# Patient Record
Sex: Male | Born: 1955 | Race: Black or African American | Hispanic: No | Marital: Single | Smoking: Never smoker
Health system: Southern US, Community
[De-identification: ages and names within clinical notes are randomized; demographics above are authoritative.]

## PROBLEM LIST (undated history)

## (undated) DIAGNOSIS — I10 Essential (primary) hypertension: Secondary | ICD-10-CM

## (undated) DIAGNOSIS — E785 Hyperlipidemia, unspecified: Secondary | ICD-10-CM

## (undated) DIAGNOSIS — E669 Obesity, unspecified: Secondary | ICD-10-CM

## (undated) HISTORY — PX: POLYPECTOMY: SHX149

## (undated) HISTORY — DX: Obesity, unspecified: E66.9

## (undated) HISTORY — DX: Hyperlipidemia, unspecified: E78.5

## (undated) HISTORY — DX: Essential (primary) hypertension: I10

---

## 2008-02-16 HISTORY — PX: COLONOSCOPY: SHX174

## 2008-05-23 ENCOUNTER — Ambulatory Visit: Payer: Self-pay | Admitting: Family Medicine

## 2008-05-24 ENCOUNTER — Encounter: Admission: RE | Admit: 2008-05-24 | Discharge: 2008-05-24 | Payer: Self-pay | Admitting: Cardiology

## 2008-06-06 ENCOUNTER — Ambulatory Visit: Payer: Self-pay | Admitting: Family Medicine

## 2008-06-21 ENCOUNTER — Ambulatory Visit: Payer: Self-pay | Admitting: Family Medicine

## 2008-06-26 ENCOUNTER — Ambulatory Visit: Payer: Self-pay | Admitting: Family Medicine

## 2008-07-16 ENCOUNTER — Ambulatory Visit: Payer: Self-pay | Admitting: Internal Medicine

## 2008-07-31 ENCOUNTER — Encounter: Payer: Self-pay | Admitting: Internal Medicine

## 2008-07-31 ENCOUNTER — Ambulatory Visit: Payer: Self-pay | Admitting: Internal Medicine

## 2008-08-02 ENCOUNTER — Encounter: Payer: Self-pay | Admitting: Internal Medicine

## 2008-08-26 ENCOUNTER — Ambulatory Visit: Payer: Self-pay | Admitting: Family Medicine

## 2008-11-29 ENCOUNTER — Ambulatory Visit: Payer: Self-pay | Admitting: Family Medicine

## 2009-01-06 ENCOUNTER — Ambulatory Visit: Payer: Self-pay | Admitting: Family Medicine

## 2009-12-26 ENCOUNTER — Ambulatory Visit: Payer: Self-pay | Admitting: Family Medicine

## 2010-02-27 ENCOUNTER — Ambulatory Visit
Admission: RE | Admit: 2010-02-27 | Discharge: 2010-02-27 | Payer: Self-pay | Source: Home / Self Care | Attending: Family Medicine | Admitting: Family Medicine

## 2010-10-28 ENCOUNTER — Encounter: Payer: Self-pay | Admitting: Family Medicine

## 2010-10-30 ENCOUNTER — Encounter: Payer: Self-pay | Admitting: Family Medicine

## 2010-10-30 ENCOUNTER — Ambulatory Visit (INDEPENDENT_AMBULATORY_CARE_PROVIDER_SITE_OTHER): Payer: BC Managed Care – PPO | Admitting: Family Medicine

## 2010-10-30 VITALS — BP 130/76 | HR 64 | Wt 182.0 lb

## 2010-10-30 DIAGNOSIS — Z0000 Encounter for general adult medical examination without abnormal findings: Secondary | ICD-10-CM

## 2010-10-30 DIAGNOSIS — Z0389 Encounter for observation for other suspected diseases and conditions ruled out: Secondary | ICD-10-CM

## 2010-10-30 DIAGNOSIS — Z23 Encounter for immunization: Secondary | ICD-10-CM

## 2010-10-30 NOTE — Progress Notes (Signed)
  Subjective:    Patient ID: Jonathan Stone, male    DOB: 1955/11/24, 55 y.o.   MRN: [id number]  HPI He is here for ear lavage. He has no particular symptoms of congestion, earache or feeling a form body in ear   Review of Systems     Objective:   Physical Exam Alert and in no distress. Both canals and TMs are normal.       Assessment & Plan:  Normal exam Flu shot will be given.

## 2011-01-29 ENCOUNTER — Ambulatory Visit (INDEPENDENT_AMBULATORY_CARE_PROVIDER_SITE_OTHER): Payer: BC Managed Care – PPO | Admitting: Family Medicine

## 2011-01-29 ENCOUNTER — Encounter: Payer: Self-pay | Admitting: Family Medicine

## 2011-01-29 VITALS — BP 122/70 | HR 67 | Wt 184.0 lb

## 2011-01-29 DIAGNOSIS — E785 Hyperlipidemia, unspecified: Secondary | ICD-10-CM | POA: Insufficient documentation

## 2011-01-29 DIAGNOSIS — Z79899 Other long term (current) drug therapy: Secondary | ICD-10-CM

## 2011-01-29 DIAGNOSIS — I10 Essential (primary) hypertension: Secondary | ICD-10-CM | POA: Insufficient documentation

## 2011-01-29 DIAGNOSIS — N529 Male erectile dysfunction, unspecified: Secondary | ICD-10-CM | POA: Insufficient documentation

## 2011-01-29 HISTORY — DX: Hyperlipidemia, unspecified: E78.5

## 2011-01-29 LAB — COMPREHENSIVE METABOLIC PANEL WITH GFR
ALT: 20 U/L (ref 0–53)
AST: 25 U/L (ref 0–37)
Albumin: 4.8 g/dL (ref 3.5–5.2)
Alkaline Phosphatase: 72 U/L (ref 39–117)
BUN: 16 mg/dL (ref 6–23)
CO2: 29 meq/L (ref 19–32)
Calcium: 10.1 mg/dL (ref 8.4–10.5)
Chloride: 102 meq/L (ref 96–112)
Creat: 1.14 mg/dL (ref 0.50–1.35)
Glucose, Bld: 97 mg/dL (ref 70–99)
Potassium: 4.2 meq/L (ref 3.5–5.3)
Sodium: 141 meq/L (ref 135–145)
Total Bilirubin: 0.8 mg/dL (ref 0.3–1.2)
Total Protein: 7.2 g/dL (ref 6.0–8.3)

## 2011-01-29 LAB — CBC WITH DIFFERENTIAL/PLATELET
Basophils Absolute: 0 10*3/uL (ref 0.0–0.1)
Basophils Relative: 0 % (ref 0–1)
Eosinophils Absolute: 0.2 10*3/uL (ref 0.0–0.7)
Eosinophils Relative: 3 % (ref 0–5)
HCT: 49.8 % (ref 39.0–52.0)
Hemoglobin: 16.6 g/dL (ref 13.0–17.0)
Lymphocytes Relative: 36 % (ref 12–46)
Lymphs Abs: 2.9 10*3/uL (ref 0.7–4.0)
MCH: 28.2 pg (ref 26.0–34.0)
MCHC: 33.3 g/dL (ref 30.0–36.0)
MCV: 84.6 fL (ref 78.0–100.0)
Monocytes Absolute: 0.7 10*3/uL (ref 0.1–1.0)
Monocytes Relative: 8 % (ref 3–12)
Neutro Abs: 4.3 10*3/uL (ref 1.7–7.7)
Neutrophils Relative %: 53 % (ref 43–77)
Platelets: 153 10*3/uL (ref 150–400)
RBC: 5.89 MIL/uL — ABNORMAL HIGH (ref 4.22–5.81)
RDW: 13.6 % (ref 11.5–15.5)
WBC: 8.1 10*3/uL (ref 4.0–10.5)

## 2011-01-29 LAB — LIPID PANEL
Cholesterol: 157 mg/dL (ref 0–200)
HDL: 41 mg/dL (ref >39)
Triglycerides: 160 mg/dL — ABNORMAL HIGH (ref <150)

## 2011-01-29 MED ORDER — ROSUVASTATIN CALCIUM 10 MG PO TABS: 10.0000 mg | ORAL_TABLET | Freq: Every day | ORAL | Status: DC

## 2011-01-29 MED ORDER — AMLODIPINE BESYLATE 5 MG PO TABS: 5.0000 mg | ORAL_TABLET | Freq: Every day | ORAL | Status: DC

## 2011-01-29 MED ORDER — LISINOPRIL-HYDROCHLOROTHIAZIDE 20-12.5 MG PO TABS: 1.0000 | ORAL_TABLET | Freq: Every day | ORAL | Status: DC

## 2011-01-29 NOTE — Progress Notes (Signed)
  Subjective:    Patient ID: Jonathan Stone, male    DOB: Jun 20, 1955, 55 y.o.   MRN: [id number]  HPI He is here for an interval evaluation. He continues on his blood pressure medications as well as Crestor. He also has yet to try the ED medication the plan is to try it in the near future.   Review of Systems     Objective:   Physical Exam  alert and in no distress. Cardiac and lung exam are normal.       Assessment & Plan:   1. Hypertension  CBC with Differential, Comprehensive metabolic panel  2. Hyperlipidemia LDL goal < 100  Lipid panel  3. ED (erectile dysfunction)  CBC with Differential, Comprehensive metabolic panel, Lipid panel  4. Encounter for long-term (current) use of other medications  CBC with Differential, Comprehensive metabolic panel, Lipid panel   he will try the ED medication and me know how works

## 2011-01-29 NOTE — Patient Instructions (Signed)
Stay on your present medications. We will call you with the blood results. Let he know if the Viagra works and I will give you more.

## 2011-10-04 ENCOUNTER — Encounter: Payer: Self-pay | Admitting: Internal Medicine

## 2011-10-26 ENCOUNTER — Telehealth: Payer: Self-pay | Admitting: Family Medicine

## 2011-10-26 NOTE — Telephone Encounter (Signed)
LM

## 2011-11-01 ENCOUNTER — Encounter: Payer: Self-pay | Admitting: Family Medicine

## 2011-11-01 ENCOUNTER — Ambulatory Visit (INDEPENDENT_AMBULATORY_CARE_PROVIDER_SITE_OTHER): Payer: BC Managed Care – PPO | Admitting: Family Medicine

## 2011-11-01 VITALS — BP 130/70 | HR 88 | Wt 175.0 lb

## 2011-11-01 DIAGNOSIS — I10 Essential (primary) hypertension: Secondary | ICD-10-CM

## 2011-11-01 NOTE — Progress Notes (Signed)
  Subjective:    Patient ID: Jonathan Stone, male    DOB: September 17, 1955, 56 y.o.   MRN: [id number]  HPI Concerning his hypertension. 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 He continues on medications listed in the chart. He has not smoke or drink. He exercises regularly.   Review of Systems     Objective:   Physical Exam Alert and in no distress. Blood pressure is recorded       Assessment & Plan:   1. Hypertension    I again reinforced the need for him to do diet and exercise. He is doing a good job. He is to recheck with me several weeks before he has his next appointment for DOT evaluation

## 2011-12-24 ENCOUNTER — Encounter: Payer: Self-pay | Admitting: Internal Medicine

## 2012-01-03 ENCOUNTER — Ambulatory Visit (INDEPENDENT_AMBULATORY_CARE_PROVIDER_SITE_OTHER): Payer: BC Managed Care – PPO | Admitting: Family Medicine

## 2012-01-03 ENCOUNTER — Encounter: Payer: Self-pay | Admitting: Family Medicine

## 2012-01-03 VITALS — BP 128/74 | Ht 66.5 in | Wt 180.0 lb

## 2012-01-03 DIAGNOSIS — I10 Essential (primary) hypertension: Secondary | ICD-10-CM

## 2012-01-03 NOTE — Progress Notes (Signed)
  Subjective:    Patient ID: Jonathan Stone, male    DOB: [birth year], 56 y.o.   MRN: [id number]  HPI He is here for recheck. He has been taking his blood pressure medicines and having no difficulty.  Review of Systems     Objective:   Physical Exam Alert and in no distress. Blood pressure is recorded.       Assessment & Plan:   1. Hypertension    I encouraged him to continue on his medications. Followup here as needed.

## 2012-03-05 ENCOUNTER — Other Ambulatory Visit: Payer: Self-pay | Admitting: Family Medicine

## 2012-03-27 ENCOUNTER — Telehealth: Payer: Self-pay | Admitting: Family Medicine

## 2012-03-27 NOTE — Telephone Encounter (Signed)
BP that day was 128/74. Please send a note

## 2012-03-27 NOTE — Telephone Encounter (Signed)
FAXED LETTER TO [facility] [employer] PER PT REQUEST

## 2012-04-10 ENCOUNTER — Encounter: Payer: Self-pay | Admitting: Family Medicine

## 2012-04-10 ENCOUNTER — Ambulatory Visit (INDEPENDENT_AMBULATORY_CARE_PROVIDER_SITE_OTHER): Payer: BC Managed Care – PPO | Admitting: Family Medicine

## 2012-04-10 VITALS — BP 140/80 | HR 88 | Wt 184.0 lb

## 2012-04-10 DIAGNOSIS — I10 Essential (primary) hypertension: Secondary | ICD-10-CM

## 2012-04-10 NOTE — Progress Notes (Signed)
  Subjective:    Patient ID: Jonathan Stone, male    DOB: 1955/05/27, 57 y.o.   MRN: [id number]  HPI He is here for blood pressure recheck. Apparently his place of work once his blood pressure rechecked.   Review of Systems     Objective:   Physical Exam  Alert and in no distress. Blood pressure is recorded.     Assessment & Plan:   Hypertension continue on present medication regimen.

## 2012-05-25 ENCOUNTER — Encounter: Payer: Self-pay | Admitting: Internal Medicine

## 2012-09-16 ENCOUNTER — Other Ambulatory Visit: Payer: Self-pay | Admitting: Family Medicine

## 2012-09-18 ENCOUNTER — Telehealth: Payer: Self-pay | Admitting: Family Medicine

## 2012-09-18 MED ORDER — AMLODIPINE BESYLATE 5 MG PO TABS: ORAL_TABLET | ORAL | Status: DC

## 2012-09-18 NOTE — Telephone Encounter (Signed)
norvasc not refilled because it was not sent for refill but now sent in

## 2012-09-18 NOTE — Telephone Encounter (Signed)
Pt called and made an appt for a medcheck for sept. Pt states two of his meds were refilled but norvasc wasn't. Please refill. Send to [facility].

## 2012-10-30 ENCOUNTER — Encounter: Payer: Self-pay | Admitting: Family Medicine

## 2012-11-17 ENCOUNTER — Encounter: Payer: Self-pay | Admitting: Family Medicine

## 2012-11-17 ENCOUNTER — Ambulatory Visit (INDEPENDENT_AMBULATORY_CARE_PROVIDER_SITE_OTHER): Payer: BC Managed Care – PPO | Admitting: Family Medicine

## 2012-11-17 VITALS — BP 132/80 | HR 62 | Wt 178.0 lb

## 2012-11-17 DIAGNOSIS — E785 Hyperlipidemia, unspecified: Secondary | ICD-10-CM

## 2012-11-17 DIAGNOSIS — R519 Headache, unspecified: Secondary | ICD-10-CM

## 2012-11-17 DIAGNOSIS — Z79899 Other long term (current) drug therapy: Secondary | ICD-10-CM

## 2012-11-17 DIAGNOSIS — I10 Essential (primary) hypertension: Secondary | ICD-10-CM

## 2012-11-17 DIAGNOSIS — R51 Headache: Secondary | ICD-10-CM

## 2012-11-17 DIAGNOSIS — Z23 Encounter for immunization: Secondary | ICD-10-CM

## 2012-11-17 LAB — CBC WITH DIFFERENTIAL/PLATELET
Basophils Absolute: 0 10*3/uL (ref 0.0–0.1)
Eosinophils Relative: 2 % (ref 0–5)
HCT: 45.7 % (ref 39.0–52.0)
Lymphocytes Relative: 43 % (ref 12–46)
MCV: 79.9 fL (ref 78.0–100.0)
Monocytes Absolute: 0.5 10*3/uL (ref 0.1–1.0)
RDW: 14.1 % (ref 11.5–15.5)
WBC: 7 10*3/uL (ref 4.0–10.5)

## 2012-11-17 LAB — LIPID PANEL
Cholesterol: 123 mg/dL (ref 0–200)
HDL: 38 mg/dL — ABNORMAL LOW (ref >39)
Total CHOL/HDL Ratio: 3.2 Ratio
Triglycerides: 147 mg/dL (ref <150)

## 2012-11-17 LAB — COMPREHENSIVE METABOLIC PANEL
AST: 24 U/L (ref 0–37)
BUN: 13 mg/dL (ref 6–23)
CO2: 30 mEq/L (ref 19–32)
Calcium: 9.9 mg/dL (ref 8.4–10.5)
Chloride: 102 mEq/L (ref 96–112)
Creat: 1.24 mg/dL (ref 0.50–1.35)

## 2012-11-17 MED ORDER — AMLODIPINE BESYLATE 5 MG PO TABS: ORAL_TABLET | ORAL | Status: DC

## 2012-11-17 MED ORDER — LISINOPRIL-HYDROCHLOROTHIAZIDE 20-12.5 MG PO TABS: ORAL_TABLET | ORAL | Status: DC

## 2012-11-17 MED ORDER — ROSUVASTATIN CALCIUM 10 MG PO TABS: ORAL_TABLET | ORAL | Status: DC

## 2012-11-17 NOTE — Progress Notes (Signed)
  Subjective:    Patient ID: Jonathan Stone, male    DOB: 22-Nov-1955, 58 y.o.   MRN: [id number]  HPI He is here for medication check. He did run out of his Crestor and missed an appointment to get back on it. He continues on his other medications. He also complains of a three-day history of left parietal scalp tenderness but no rash. He does not describe this as a headache. His had no fever, chills earache or sore throat.  Review of Systems     Objective:   Physical Exam Alert and in no distress. Exam of the scalp shows no visible lesions. TMs, throat and neck normal.       Assessment & Plan:  Hypertension - Plan: CBC with Differential, Comprehensive metabolic panel, amLODipine (NORVASC) 5 MG tablet, lisinopril-hydrochlorothiazide (PRINZIDE,ZESTORETIC) 20-12.5 MG per tablet  Hyperlipidemia LDL goal < 100 - Plan: Lipid panel, rosuvastatin (CRESTOR) 10 MG tablet  Need for prophylactic vaccination and inoculation against influenza - Plan: Flu Vaccine QUAD 36+ mos IM  Encounter for long-term (current) use of other medications  Headache  flu shot given with risks and benefits discussed. I will renew his medications and do routine screening. Discussed the headache and the possibility of this being early Shingles. He will be vigilant concerning the appearance of a rash.

## 2012-12-18 ENCOUNTER — Ambulatory Visit: Payer: BC Managed Care – PPO | Admitting: Family Medicine

## 2012-12-25 ENCOUNTER — Ambulatory Visit (INDEPENDENT_AMBULATORY_CARE_PROVIDER_SITE_OTHER): Payer: BC Managed Care – PPO | Admitting: Family Medicine

## 2012-12-25 ENCOUNTER — Encounter: Payer: Self-pay | Admitting: Family Medicine

## 2012-12-25 VITALS — BP 120/80 | HR 78 | Wt 182.0 lb

## 2012-12-25 DIAGNOSIS — I10 Essential (primary) hypertension: Secondary | ICD-10-CM

## 2012-12-25 NOTE — Progress Notes (Signed)
  Subjective:    Patient ID: Jonathan Stone, male    DOB: 08-30-1955, 57 y.o.   MRN: [id number]  HPI Is here  for blood pressure recheck. 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 He is here for a followup concerning this.   Review of Systems     Objective:   Physical Exam  Alert and in no distress. Blood pressure is recorded.      Assessment & Plan:  Hypertension  a letter was written with his present blood pressure reading.

## 2013-10-22 ENCOUNTER — Other Ambulatory Visit: Payer: Self-pay | Admitting: Family Medicine

## 2013-10-24 ENCOUNTER — Encounter: Payer: Self-pay | Admitting: Internal Medicine

## 2013-11-19 ENCOUNTER — Telehealth: Payer: Self-pay | Admitting: Internal Medicine

## 2013-11-19 ENCOUNTER — Encounter: Payer: Self-pay | Admitting: Family Medicine

## 2013-11-19 ENCOUNTER — Ambulatory Visit (INDEPENDENT_AMBULATORY_CARE_PROVIDER_SITE_OTHER): Payer: BC Managed Care – PPO | Admitting: Family Medicine

## 2013-11-19 VITALS — BP 150/88 | HR 72 | Wt 183.0 lb

## 2013-11-19 DIAGNOSIS — R319 Hematuria, unspecified: Secondary | ICD-10-CM

## 2013-11-19 DIAGNOSIS — Z23 Encounter for immunization: Secondary | ICD-10-CM

## 2013-11-19 LAB — POCT URINALYSIS DIPSTICK
BILIRUBIN UA: NEGATIVE
Blood, UA: 50
GLUCOSE UA: NEGATIVE
Ketones, UA: NEGATIVE
LEUKOCYTES UA: NEGATIVE
NITRITE UA: NEGATIVE
Protein, UA: NEGATIVE
Spec Grav, UA: 1.02
UROBILINOGEN UA: NEGATIVE
pH, UA: 5

## 2013-11-19 NOTE — Telephone Encounter (Signed)
Pt called back and can not make his appt on October 13th for a renal US so he will call [facility] and reschedule his appt. Pt was also told that he  needed to follow-up within a month to see Dr. Redmond School again

## 2013-11-19 NOTE — Progress Notes (Signed)
   Subjective:    Patient ID: Jonathan Stone, male    DOB: June 12, 1955, 58 y.o.   MRN: [id number]  HPI He is here for evaluation of blood in his urine. 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 Review of my record indicates he has never mentioned that to me. He's had no back pain, abdominal pain, urinary symptoms.   Review of Systems     Objective:   Physical Exam Alert and in no distress. Urine dipstick did show red cells. Urine microscopic showed scattered red cells.     Assessment & Plan:  Immunization due - Plan: Flu Vaccine QUAD 36+ mos IM  Blood in urine - Plan: POCT urinalysis dipstick  Hematuria, undiagnosed cause - Plan: US Renal  Since he has had previous reports of blood in his urine, I think it is warranted to take the next step and get an ultrasound. He will also return here for repeat urinalysis in several months.

## 2013-11-19 NOTE — Telephone Encounter (Signed)
Called an left a message for pt to call me back.  I have set him up for a renal US on October 13th @ 10:00am. He is to be there at 9:45am for registration. He is to go to [address] [address]. If he can not make it he can call [facility] at [phone] to reschedule

## 2013-11-26 ENCOUNTER — Ambulatory Visit
Admission: RE | Admit: 2013-11-26 | Discharge: 2013-11-26 | Disposition: A | Discharge status: Home / Self Care | Payer: BC Managed Care – PPO | Source: Ambulatory Visit | Attending: Family Medicine | Admitting: Family Medicine

## 2013-11-26 DIAGNOSIS — R319 Hematuria, unspecified: Secondary | ICD-10-CM

## 2013-11-27 ENCOUNTER — Other Ambulatory Visit: Payer: BC Managed Care – PPO

## 2013-12-15 ENCOUNTER — Telehealth: Payer: Self-pay | Admitting: Internal Medicine

## 2013-12-15 NOTE — Telephone Encounter (Signed)
Faxed over medical records to [facility] on 11/26/13 @ [phone]

## 2013-12-22 ENCOUNTER — Other Ambulatory Visit: Payer: Self-pay | Admitting: Family Medicine

## 2013-12-28 ENCOUNTER — Other Ambulatory Visit: Payer: Self-pay | Admitting: Family Medicine

## 2014-01-14 ENCOUNTER — Other Ambulatory Visit: Payer: Self-pay

## 2014-01-14 ENCOUNTER — Encounter: Payer: Self-pay | Admitting: Family Medicine

## 2014-01-14 ENCOUNTER — Ambulatory Visit (INDEPENDENT_AMBULATORY_CARE_PROVIDER_SITE_OTHER): Payer: BC Managed Care – PPO | Admitting: Family Medicine

## 2014-01-14 VITALS — BP 130/90 | HR 64 | Wt 183.0 lb

## 2014-01-14 DIAGNOSIS — R8290 Unspecified abnormal findings in urine: Secondary | ICD-10-CM

## 2014-01-14 DIAGNOSIS — R319 Hematuria, unspecified: Secondary | ICD-10-CM

## 2014-01-14 LAB — CBC WITH DIFFERENTIAL/PLATELET
BASOS ABS: 0 10*3/uL (ref 0.0–0.1)
Basophils Relative: 0 % (ref 0–1)
EOS ABS: 0.2 10*3/uL (ref 0.0–0.7)
EOS PCT: 3 % (ref 0–5)
HCT: 48.3 % (ref 39.0–52.0)
Hemoglobin: 16.7 g/dL (ref 13.0–17.0)
LYMPHS PCT: 47 % — AB (ref 12–46)
Lymphs Abs: 3.7 10*3/uL (ref 0.7–4.0)
MCH: 27.7 pg (ref 26.0–34.0)
MCHC: 34.6 g/dL (ref 30.0–36.0)
MCV: 80.2 fL (ref 78.0–100.0)
MPV: 11.5 fL (ref 9.4–12.4)
Monocytes Absolute: 0.6 10*3/uL (ref 0.1–1.0)
Monocytes Relative: 7 % (ref 3–12)
NEUTROS PCT: 43 % (ref 43–77)
Neutro Abs: 3.4 10*3/uL (ref 1.7–7.7)
PLATELETS: 166 10*3/uL (ref 150–400)
RBC: 6.02 MIL/uL — ABNORMAL HIGH (ref 4.22–5.81)
RDW: 14.2 % (ref 11.5–15.5)
WBC: 7.9 10*3/uL (ref 4.0–10.5)

## 2014-01-14 LAB — COMPREHENSIVE METABOLIC PANEL
ALK PHOS: 79 U/L (ref 39–117)
ALT: 20 U/L (ref 0–53)
AST: 24 U/L (ref 0–37)
Albumin: 4.5 g/dL (ref 3.5–5.2)
BUN: 11 mg/dL (ref 6–23)
CO2: 29 mEq/L (ref 19–32)
CREATININE: 1.23 mg/dL (ref 0.50–1.35)
Calcium: 9.7 mg/dL (ref 8.4–10.5)
Chloride: 100 mEq/L (ref 96–112)
Glucose, Bld: 101 mg/dL — ABNORMAL HIGH (ref 70–99)
POTASSIUM: 4.2 meq/L (ref 3.5–5.3)
Sodium: 138 mEq/L (ref 135–145)
Total Bilirubin: 0.6 mg/dL (ref 0.2–1.2)
Total Protein: 7.3 g/dL (ref 6.0–8.3)

## 2014-01-14 LAB — POCT URINALYSIS DIPSTICK
Bilirubin, UA: NEGATIVE
Blood, UA: POSITIVE
GLUCOSE UA: NEGATIVE
Ketones, UA: NEGATIVE
LEUKOCYTES UA: NEGATIVE
NITRITE UA: NEGATIVE
PROTEIN UA: NEGATIVE
SPEC GRAV UA: 1.025
UROBILINOGEN UA: NEGATIVE
pH, UA: 6

## 2014-01-14 MED ORDER — AMLODIPINE BESYLATE 5 MG PO TABS: 5.0000 mg | ORAL_TABLET | Freq: Every day | ORAL | Status: DC

## 2014-01-14 MED ORDER — ROSUVASTATIN CALCIUM 10 MG PO TABS: 10.0000 mg | ORAL_TABLET | Freq: Every day | ORAL | Status: DC

## 2014-01-14 MED ORDER — LISINOPRIL-HYDROCHLOROTHIAZIDE 20-12.5 MG PO TABS: 1.0000 | ORAL_TABLET | Freq: Every day | ORAL | Status: DC

## 2014-01-14 NOTE — Progress Notes (Signed)
   Subjective:    Patient ID: Jonathan Stone, male    DOB: August 31, 1955, 58 y.o.   MRN: [id number]  HPI He is here for a recheck on history of hematuria. Doesn't really he is having no symptoms.   Review of Systems     Objective:   Physical Exam Urine dipstick did show red cells however on microscopic no red cells were seen.       Assessment & Plan:  Abnormal urine - Plan: POCT Urinalysis Dipstick, CBC with Differential, Comprehensive metabolic panel, Ambulatory referral to Urology  Hematuria - Plan: CBC with Differential, Comprehensive metabolic panel, Ambulatory referral to Urology  I will set him up for urology evaluation to cover all the bases.

## 2014-07-17 ENCOUNTER — Ambulatory Visit: Payer: Self-pay | Admitting: Family Medicine

## 2014-07-30 ENCOUNTER — Telehealth: Payer: Self-pay | Admitting: Internal Medicine

## 2014-07-30 NOTE — Telephone Encounter (Signed)
Faxed over medical records to [facility] @ [phone]

## 2014-08-23 ENCOUNTER — Encounter: Payer: Self-pay | Admitting: Family Medicine

## 2014-08-23 ENCOUNTER — Ambulatory Visit (INDEPENDENT_AMBULATORY_CARE_PROVIDER_SITE_OTHER): Payer: BLUE CROSS/BLUE SHIELD | Admitting: Family Medicine

## 2014-08-23 VITALS — BP 130/80 | HR 60 | Ht 67.0 in | Wt 179.0 lb

## 2014-08-23 DIAGNOSIS — I10 Essential (primary) hypertension: Secondary | ICD-10-CM | POA: Diagnosis not present

## 2014-08-23 DIAGNOSIS — E785 Hyperlipidemia, unspecified: Secondary | ICD-10-CM | POA: Diagnosis not present

## 2014-08-23 DIAGNOSIS — Z0000 Encounter for general adult medical examination without abnormal findings: Secondary | ICD-10-CM

## 2014-08-23 DIAGNOSIS — N528 Other male erectile dysfunction: Secondary | ICD-10-CM | POA: Diagnosis not present

## 2014-08-23 DIAGNOSIS — R319 Hematuria, unspecified: Secondary | ICD-10-CM | POA: Diagnosis not present

## 2014-08-23 LAB — LIPID PANEL
CHOL/HDL RATIO: 2.9 ratio
Cholesterol: 129 mg/dL (ref 0–200)
HDL: 44 mg/dL (ref >=40)
LDL CALC: 67 mg/dL (ref 0–99)
Triglycerides: 92 mg/dL (ref <150)
VLDL: 18 mg/dL (ref 0–40)

## 2014-08-23 LAB — POCT URINALYSIS DIPSTICK
BILIRUBIN UA: NEGATIVE
GLUCOSE UA: NEGATIVE
KETONES UA: NEGATIVE
Leukocytes, UA: NEGATIVE
Nitrite, UA: NEGATIVE
SPEC GRAV UA: 1.025
UROBILINOGEN UA: NEGATIVE
pH, UA: 6

## 2014-08-23 MED ORDER — AMLODIPINE BESYLATE 5 MG PO TABS: 5.0000 mg | ORAL_TABLET | Freq: Every day | ORAL | Status: DC

## 2014-08-23 MED ORDER — LISINOPRIL-HYDROCHLOROTHIAZIDE 20-12.5 MG PO TABS: 1.0000 | ORAL_TABLET | Freq: Every day | ORAL | Status: DC

## 2014-08-23 MED ORDER — ROSUVASTATIN CALCIUM 10 MG PO TABS: 10.0000 mg | ORAL_TABLET | Freq: Every day | ORAL | Status: DC

## 2014-08-23 NOTE — Progress Notes (Signed)
   Subjective:    Patient ID: Jonathan Stone, male    DOB: 06-21-1955, 59 y.o.   MRN: [id number]  HPI He is here for an examination. He does have a history of hypertension as well as hyperlipidemia. He is on appropriate medications for this. He has had difficulty with erectile dysfunction the past but none recently. He has a history of hematuria and an appointment was set up. That record is not here. He has no other concerns or complaints. His work and home life are going well. Family and social history as well as health maintenance was reviewed. He has had no chest pain, nausea, vomiting, other GI symptoms   Review of Systems  All other systems reviewed and are negative.      Objective:   Physical Exam BP 130/80 mmHg  Pulse 60  Ht 5\' 7"  (1.702 m)  Wt 179 lb (81.194 kg)  BMI 28.03 kg/m2  SpO2 98%  General Appearance:    Alert, cooperative, no distress, appears stated age  Head:    Normocephalic, without obvious abnormality, atraumatic  Eyes:    PERRL, conjunctiva/corneas clear, EOM's intact, fundi    benign  Ears:    Normal TM's and external ear canals  Nose:   Nares normal, mucosa normal, no drainage or sinus   tenderness  Throat:   Lips, mucosa, and tongue normal; teeth and gums normal  Neck:   Supple, no lymphadenopathy;  thyroid:  no   enlargement/tenderness/nodules; no carotid   bruit or JVD  Back:    Spine nontender, no curvature, ROM normal, no CVA     tenderness  Lungs:     Clear to auscultation bilaterally without wheezes, rales or     ronchi; respirations unlabored  Chest Wall:    No tenderness or deformity   Heart:    Regular rate and rhythm, S1 and S2 normal, 1/6 SEM, no rub   or gallop  Breast Exam:    No chest wall tenderness, masses or gynecomastia  Abdomen:     Soft, non-tender, nondistended, normoactive bowel sounds,    no masses, no hepatosplenomegaly        Extremities:   No clubbing, cyanosis or edema  Pulses:   2+ and symmetric all extremities  Skin:    Skin color, texture, turgor normal, no rashes or lesions  Lymph nodes:   Cervical, supraclavicular, and axillary nodes normal  Neurologic:   CNII-XII intact, normal strength, sensation and gait; reflexes 2+ and symmetric throughout          Psych:   Normal mood, affect, hygiene and grooming.   Urine dipstick is positive for red cells.       Assessment & Plan:  Routine general medical examination at a health care facility - Plan: POCT Urinalysis Dipstick, Lipid panel  Essential hypertension - Plan: lisinopril-hydrochlorothiazide (PRINZIDE,ZESTORETIC) 20-12.5 MG per tablet, amLODipine (NORVASC) 5 MG tablet  Hyperlipidemia with target LDL less than 100 - Plan: Lipid panel, rosuvastatin (CRESTOR) 10 MG tablet  Other male erectile dysfunction  Hematuria - Plan: Ambulatory referral to Urology Apparently an appointment was made However he never did go in for evaluation of the hematuria. I will set this up again.

## 2015-01-14 IMAGING — US US RENAL
1 series · 14 of 25 positions shown · non-contrast
Comparison: None

CLINICAL DATA: Microscopic hematuria, no cause

EXAM:
RENAL/URINARY TRACT ULTRASOUND COMPLETE

[Series 1: us renal · 0.26mm/px · 14 of 45 slices shown]
[im 1/45]
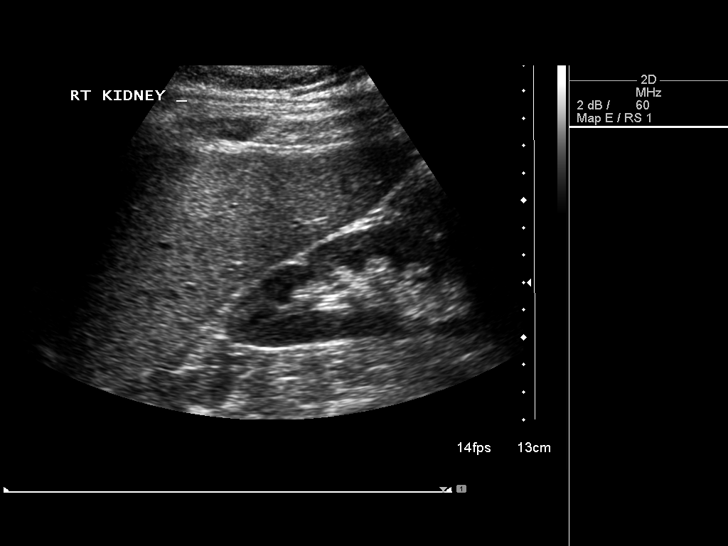
[im 4/45]
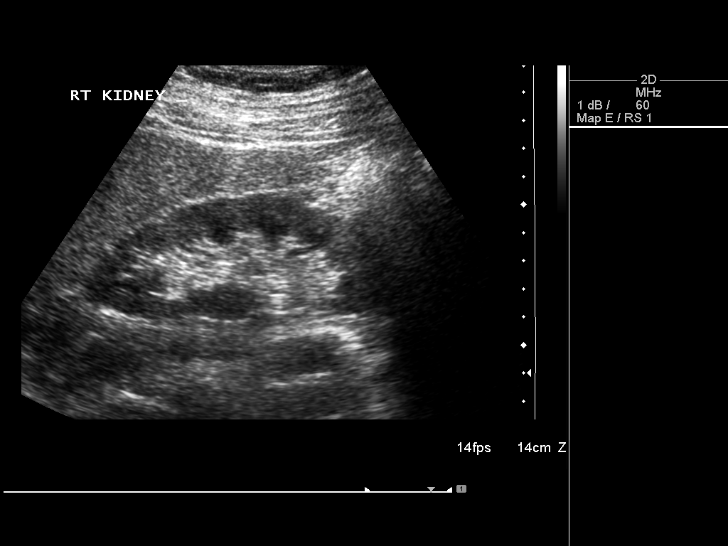
[im 8/45]
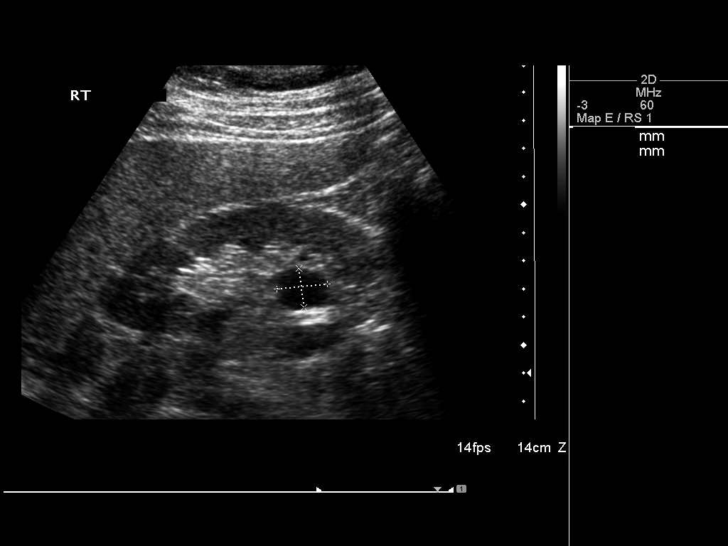
[im 12/45]
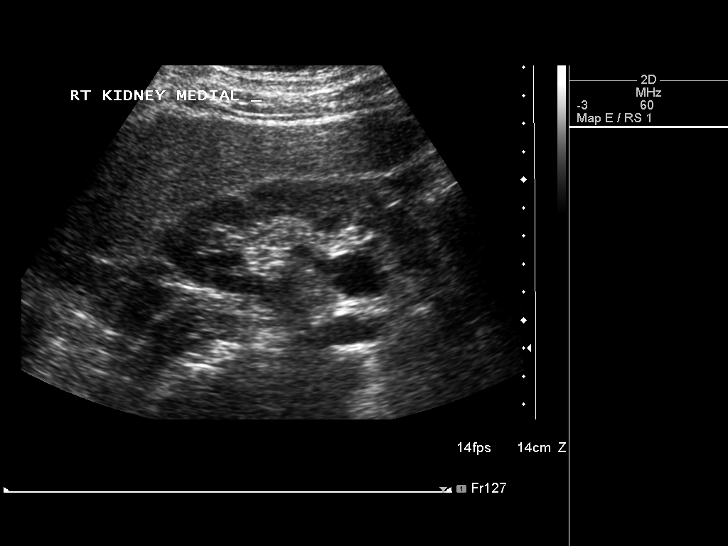
[im 15/45]
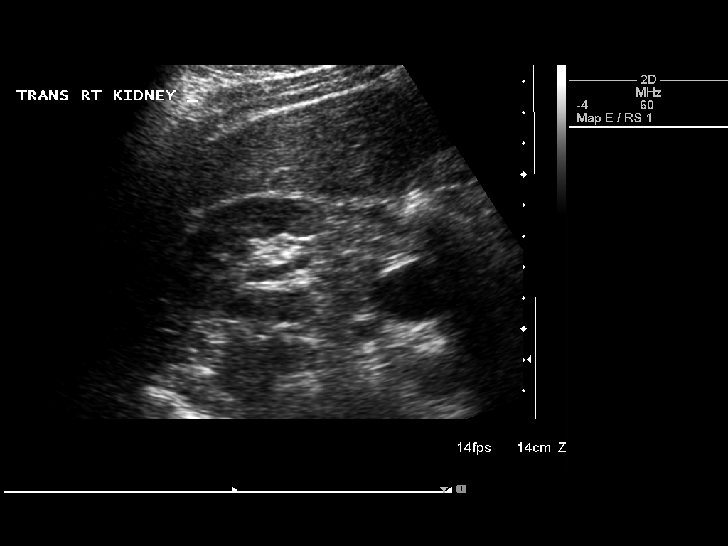
[im 17/45]
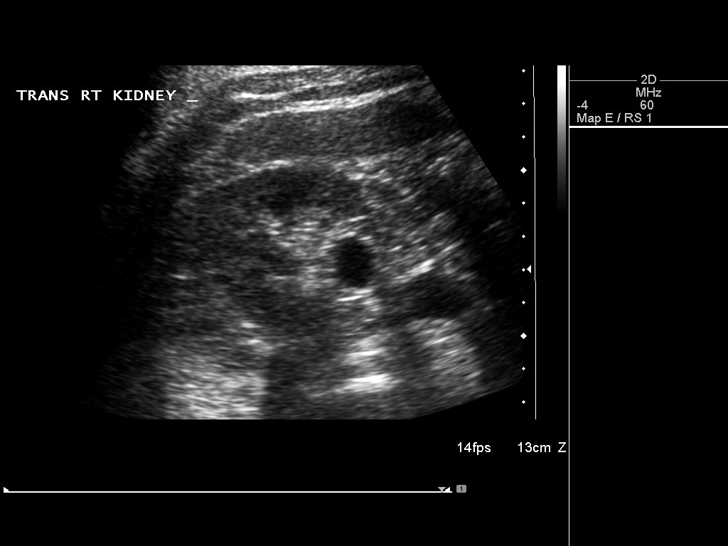
[im 21/45]
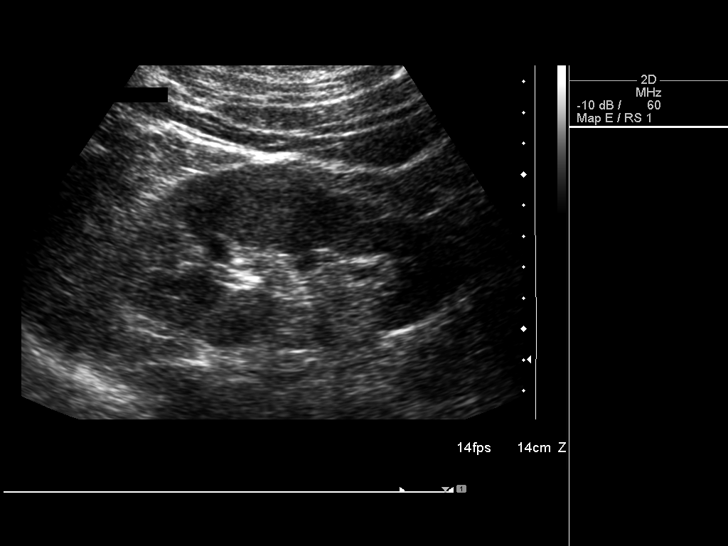
[im 24/45]
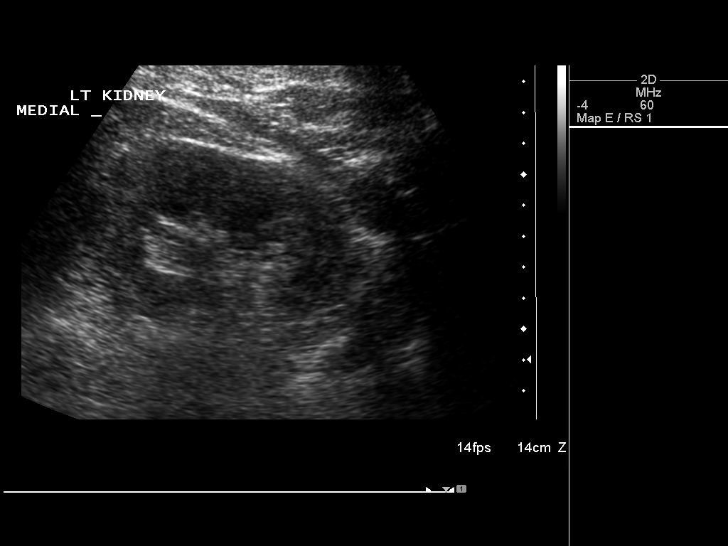
[im 28/45]
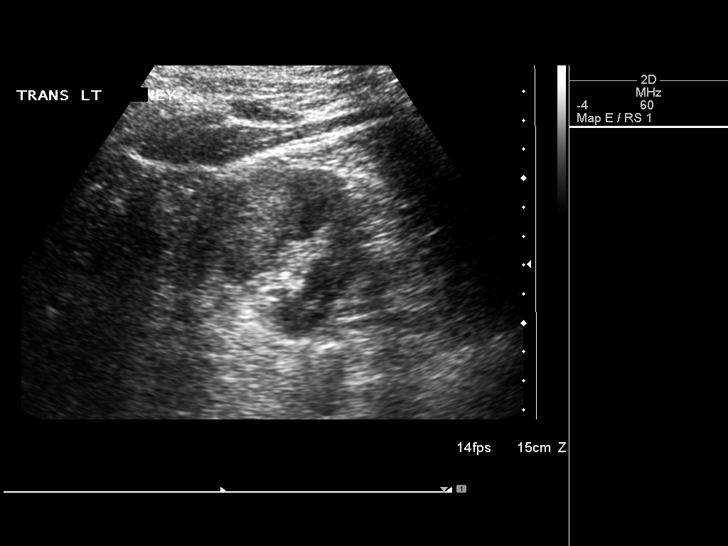
[im 30/45]
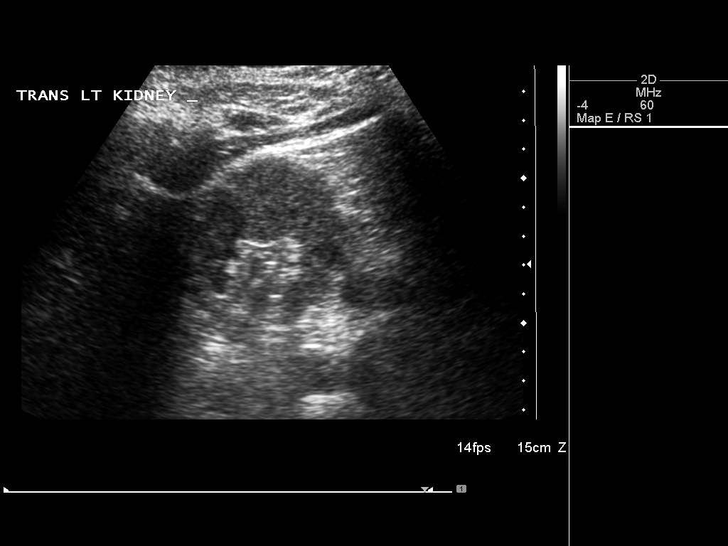
[im 34/45]
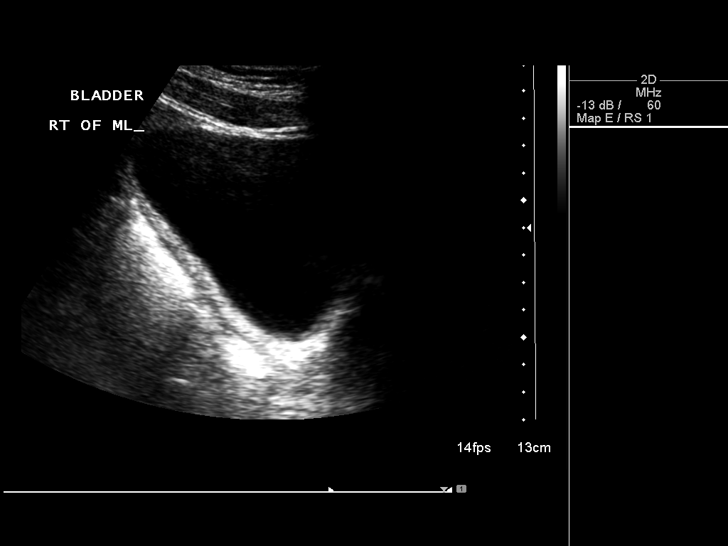
[im 37/45]
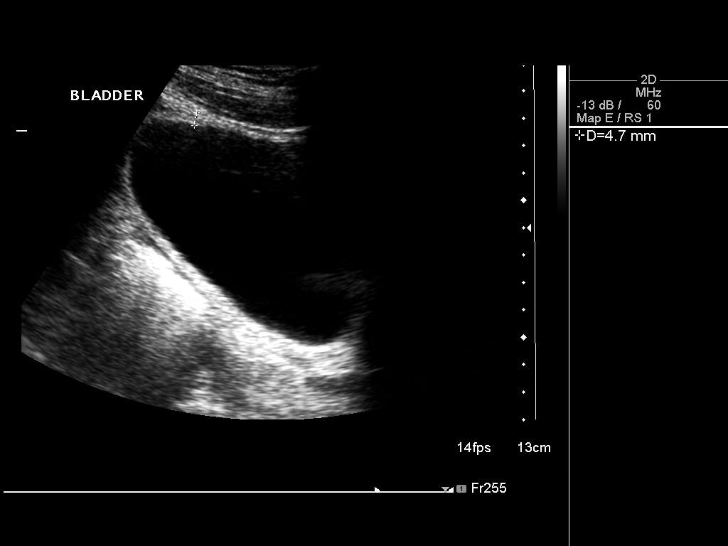
[im 41/45]
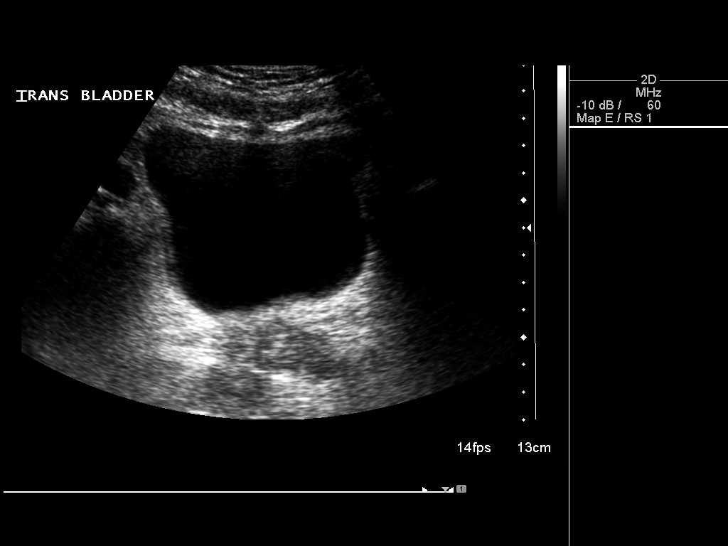
[im 45/45]
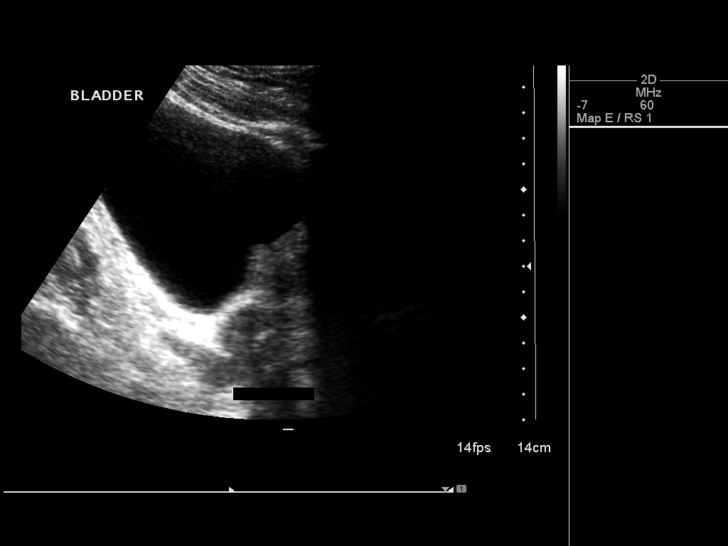

[14 of 25 positions shown; findings below may reference images not displayed]

FINDINGS: Right Kidney:

Length: 11.8 cm. There is a 1.8 x 1.4 x 1.5 cm parapelvic cyst.
Echogenicity within normal limits. No mass or hydronephrosis
visualized.

Left Kidney:

Length: 11.5 cm. Echogenicity within normal limits. No mass or
hydronephrosis visualized.

Bladder:

Mild relative bladder wall thickening.
IMPRESSION: 1. No obstructive uropathy.
2. No nephrolithiasis.
3. Mi relative bladder wall thickening which may be secondary to
under distention versus cystitis versus other intrinsic bladder wall
abnormality.

## 2015-09-19 ENCOUNTER — Encounter: Payer: BLUE CROSS/BLUE SHIELD | Admitting: Family Medicine

## 2015-09-22 ENCOUNTER — Ambulatory Visit (INDEPENDENT_AMBULATORY_CARE_PROVIDER_SITE_OTHER): Payer: BLUE CROSS/BLUE SHIELD | Admitting: Family Medicine

## 2015-09-22 ENCOUNTER — Encounter: Payer: Self-pay | Admitting: Family Medicine

## 2015-09-22 VITALS — BP 180/100 | HR 60 | Ht 66.5 in | Wt 184.6 lb

## 2015-09-22 DIAGNOSIS — I10 Essential (primary) hypertension: Secondary | ICD-10-CM

## 2015-09-22 DIAGNOSIS — Z0000 Encounter for general adult medical examination without abnormal findings: Secondary | ICD-10-CM

## 2015-09-22 DIAGNOSIS — E785 Hyperlipidemia, unspecified: Secondary | ICD-10-CM

## 2015-09-22 DIAGNOSIS — Z1159 Encounter for screening for other viral diseases: Secondary | ICD-10-CM | POA: Diagnosis not present

## 2015-09-22 LAB — CBC WITH DIFFERENTIAL/PLATELET
BASOS PCT: 0 %
Basophils Absolute: 0 cells/uL (ref 0–200)
Eosinophils Absolute: 234 cells/uL (ref 15–500)
Eosinophils Relative: 3 %
HCT: 46.5 % (ref 38.5–50.0)
Hemoglobin: 15.9 g/dL (ref 13.2–17.1)
Lymphocytes Relative: 44 %
Lymphs Abs: 3432 cells/uL (ref 850–3900)
MCH: 27.4 pg (ref 27.0–33.0)
MCHC: 34.2 g/dL (ref 32.0–36.0)
MCV: 80 fL (ref 80.0–100.0)
MONOS PCT: 8 %
MPV: 12.8 fL — AB (ref 7.5–12.5)
Monocytes Absolute: 624 cells/uL (ref 200–950)
NEUTROS ABS: 3510 {cells}/uL (ref 1500–7800)
Neutrophils Relative %: 45 %
PLATELETS: 154 10*3/uL (ref 140–400)
RBC: 5.81 MIL/uL — AB (ref 4.20–5.80)
RDW: 13.9 % (ref 11.0–15.0)
WBC: 7.8 10*3/uL (ref 4.0–10.5)

## 2015-09-22 LAB — LIPID PANEL
Cholesterol: 163 mg/dL (ref 125–200)
HDL: 48 mg/dL (ref >=40)
LDL Cholesterol: 99 mg/dL (ref <130)
Total CHOL/HDL Ratio: 3.4 Ratio (ref <=5.0)
Triglycerides: 80 mg/dL (ref <150)
VLDL: 16 mg/dL (ref <30)

## 2015-09-22 LAB — COMPREHENSIVE METABOLIC PANEL
ALT: 24 U/L (ref 9–46)
AST: 21 U/L (ref 10–35)
Albumin: 4.3 g/dL (ref 3.6–5.1)
Alkaline Phosphatase: 66 U/L (ref 40–115)
BILIRUBIN TOTAL: 0.6 mg/dL (ref 0.2–1.2)
BUN: 13 mg/dL (ref 7–25)
CO2: 29 mmol/L (ref 20–31)
CREATININE: 1.12 mg/dL (ref 0.70–1.33)
Calcium: 9.4 mg/dL (ref 8.6–10.3)
Chloride: 104 mmol/L (ref 98–110)
Glucose, Bld: 93 mg/dL (ref 65–99)
Potassium: 4.7 mmol/L (ref 3.5–5.3)
SODIUM: 140 mmol/L (ref 135–146)
TOTAL PROTEIN: 6.6 g/dL (ref 6.1–8.1)

## 2015-09-22 MED ORDER — AMLODIPINE BESYLATE 5 MG PO TABS: 5.0000 mg | ORAL_TABLET | Freq: Every day | ORAL | 3 refills | Status: DC

## 2015-09-22 MED ORDER — LISINOPRIL-HYDROCHLOROTHIAZIDE 20-12.5 MG PO TABS: 1.0000 | ORAL_TABLET | Freq: Every day | ORAL | 3 refills | Status: DC

## 2015-09-22 NOTE — Progress Notes (Signed)
Subjective:    Patient ID: Jonathan Stone, male    DOB: 1956-01-22, 60 y.o.   MRN: [id number]  HPI He is here for complete examination. He did stop taking his medications and tried to treat his blood pressure and lipids with nutritional supplements but now realizes that this did not work.He would like to be placed back on medication. He also has a history of he the but states that this is not causing any difficulty. He has recently started exercising again. He does not smoke or drink. He works as a Administrator. He is single and apparently dating someone. He is not interested in STD testing. Family and social history as well as health maintenance and immunizations were reviewed. He has no other concerns or complaints.   Review of Systems  All other systems reviewed and are negative.      Objective:   Physical Exam BP (!) 180/100 (BP Location: Left Arm, Patient Position: Sitting, Cuff Size: Normal)   Pulse 60   Ht 5' 6.5" (1.689 m)   Wt 184 lb 9.6 oz (83.7 kg)   SpO2 96%   BMI 29.35 kg/m   General Appearance:    Alert, cooperative, no distress, appears stated age  Head:    Normocephalic, without obvious abnormality, atraumatic  Eyes:    PERRL, conjunctiva/corneas clear, EOM's intact, fundi    benign  Ears:    Normal TM's and external ear canals  Nose:   Nares normal, mucosa normal, no drainage or sinus   tenderness  Throat:   Lips, mucosa, and tongue normal; teeth and gums normal  Neck:   Supple, no lymphadenopathy;  thyroid:  no   enlargement/tenderness/nodules; no carotid   bruit or JVD  Back:    Spine nontender, no curvature, ROM normal, no CVA     tenderness  Lungs:     Clear to auscultation bilaterally without wheezes, rales or     ronchi; respirations unlabored  Chest Wall:    No tenderness or deformity   Heart:    Regular rate and rhythm, S1 and S2 normal, no murmur, rub   or gallop  Breast Exam:    No chest wall tenderness, masses or gynecomastia  Abdomen:     Soft,  non-tender, nondistended, normoactive bowel sounds,    no masses, no hepatosplenomegaly  Genitalia:    Normal male external genitalia without lesions.  Testicles without masses.  No inguinal hernias.     Extremities:   No clubbing, cyanosis or edema  Pulses:   2+ and symmetric all extremities  Skin:   Skin color, texture, turgor normal, no rashes or lesions  Lymph nodes:   Cervical, supraclavicular, and axillary nodes normal  Neurologic:   CNII-XII intact, normal strength, sensation and gait; reflexes 2+ and symmetric throughout          Psych:   Normal mood, affect, hygiene and grooming.          Assessment & Plan:  Routine general medical examination at a health care facility - Plan: Comprehensive metabolic panel, Lipid panel, CBC with Differential/Platelet  Essential hypertension - Plan: lisinopril-hydrochlorothiazide (PRINZIDE,ZESTORETIC) 20-12.5 MG tablet, amLODipine (NORVASC) 5 MG tablet  Hyperlipidemia with target LDL less than 100  Need for hepatitis C screening test - Plan: Hepatitis C antibody I will place him back on his blood pressure medications will wait to see what his lipids are.He is to return here in one month for recheck on his blood pressure. I will also give him  Zostavax at that time.

## 2015-09-23 LAB — HEPATITIS C ANTIBODY: HCV AB: NEGATIVE

## 2015-09-25 ENCOUNTER — Telehealth: Payer: Self-pay

## 2015-09-25 NOTE — Telephone Encounter (Signed)
Records faxed for CDL certification exam.

## 2015-10-24 ENCOUNTER — Ambulatory Visit: Payer: BLUE CROSS/BLUE SHIELD | Admitting: Family Medicine

## 2015-11-12 ENCOUNTER — Encounter: Payer: Self-pay | Admitting: Family Medicine

## 2015-11-12 ENCOUNTER — Ambulatory Visit: Payer: BLUE CROSS/BLUE SHIELD | Admitting: Family Medicine

## 2015-11-12 ENCOUNTER — Ambulatory Visit (INDEPENDENT_AMBULATORY_CARE_PROVIDER_SITE_OTHER): Payer: BLUE CROSS/BLUE SHIELD | Admitting: Family Medicine

## 2015-11-12 VITALS — BP 150/70 | HR 68 | Resp 18 | Wt 182.2 lb

## 2015-11-12 DIAGNOSIS — I10 Essential (primary) hypertension: Secondary | ICD-10-CM | POA: Diagnosis not present

## 2015-11-12 DIAGNOSIS — Z23 Encounter for immunization: Secondary | ICD-10-CM | POA: Diagnosis not present

## 2015-11-12 DIAGNOSIS — H5203 Hypermetropia, bilateral: Secondary | ICD-10-CM | POA: Diagnosis not present

## 2015-11-12 NOTE — Progress Notes (Signed)
   Subjective:    Patient ID: Jonathan Stone, male    DOB: 10-16-1955, 60 y.o.   MRN: [id number]  HPI He is here for a recheck. He is now on his blood pressure medication and is having no difficulty with this. He is also here to get an update on his immunizations including Zostavax.    Review of Systems     Objective:   Physical Exam   Alert and in no distress. Blood pressure is recorded.      Assessment & Plan:  Essential hypertension  Need for prophylactic vaccination and inoculation against influenza - Plan: Flu Vaccine QUAD 36+ mos IM  Need for shingles vaccine - Plan: Varicella-zoster vaccine subcutaneous Continue on present medication regimen.

## 2016-07-25 ENCOUNTER — Other Ambulatory Visit: Payer: Self-pay | Admitting: Family Medicine

## 2016-07-25 DIAGNOSIS — I10 Essential (primary) hypertension: Secondary | ICD-10-CM

## 2017-02-14 ENCOUNTER — Other Ambulatory Visit: Payer: Self-pay | Admitting: Family Medicine

## 2017-02-14 DIAGNOSIS — I10 Essential (primary) hypertension: Secondary | ICD-10-CM

## 2017-05-30 ENCOUNTER — Other Ambulatory Visit: Payer: Self-pay | Admitting: Family Medicine

## 2017-05-30 DIAGNOSIS — I10 Essential (primary) hypertension: Secondary | ICD-10-CM

## 2017-08-05 ENCOUNTER — Encounter: Payer: Self-pay | Admitting: Family Medicine

## 2017-08-05 ENCOUNTER — Ambulatory Visit: Payer: BLUE CROSS/BLUE SHIELD | Admitting: Family Medicine

## 2017-08-05 VITALS — BP 162/80 | HR 62 | Temp 98.0°F | Ht 65.0 in | Wt 185.6 lb

## 2017-08-05 DIAGNOSIS — I10 Essential (primary) hypertension: Secondary | ICD-10-CM

## 2017-08-05 DIAGNOSIS — E785 Hyperlipidemia, unspecified: Secondary | ICD-10-CM | POA: Diagnosis not present

## 2017-08-05 DIAGNOSIS — Z0000 Encounter for general adult medical examination without abnormal findings: Secondary | ICD-10-CM | POA: Diagnosis not present

## 2017-08-05 DIAGNOSIS — Z79899 Other long term (current) drug therapy: Secondary | ICD-10-CM

## 2017-08-05 DIAGNOSIS — Z125 Encounter for screening for malignant neoplasm of prostate: Secondary | ICD-10-CM

## 2017-08-05 LAB — POCT URINALYSIS DIP (PROADVANTAGE DEVICE)
BILIRUBIN UA: NEGATIVE
BILIRUBIN UA: NEGATIVE mg/dL
Glucose, UA: NEGATIVE mg/dL
Leukocytes, UA: NEGATIVE
Nitrite, UA: NEGATIVE
PROTEIN UA: NEGATIVE mg/dL
SPECIFIC GRAVITY, URINE: 1.03
Urobilinogen, Ur: 3.5
pH, UA: 5.5 (ref 5.0–8.0)

## 2017-08-05 MED ORDER — AMLODIPINE BESYLATE 10 MG PO TABS: 10.0000 mg | ORAL_TABLET | Freq: Every day | ORAL | 3 refills | Status: DC

## 2017-08-05 NOTE — Patient Instructions (Signed)
20 minutes of something physical every day or 150 minutes a week of something physical Cut back on white food which is bread, rice, pasta, potatoes and sugar.

## 2017-08-05 NOTE — Progress Notes (Signed)
   Subjective:    Patient ID: Jonathan Stone, male    DOB: 08/15/1955, 62 y.o.   MRN: [id number]  HPI He is here for complete examination.  He does have underlying hypertension and presently is on lisinopril as well as amlodipine.  He was supposed to be on Crestor but did not get it filled.  He has not been here since 2017.  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  He has no other concerns or complaints.  No history of allergies, cardiac pulmonary or GI problems.   Review of Systems  All other systems reviewed and are negative.      Objective:   Physical Exam BP (!) 162/80 (BP Location: Left Arm, Patient Position: Sitting)   Pulse 62   Temp 98 F (36.7 C)   Ht 5\' 5"  (1.651 m)   Wt 185 lb 9.6 oz (84.2 kg)   SpO2 98%   BMI 30.89 kg/m   General Appearance:    Alert, cooperative, no distress, appears stated age  Head:    Normocephalic, without obvious abnormality, atraumatic  Eyes:    PERRL, conjunctiva/corneas clear, EOM's intact, fundi    benign  Ears:    Normal TM's and external ear canals  Nose:   Nares normal, mucosa normal, no drainage or sinus   tenderness  Throat:   Lips, mucosa, and tongue normal; teeth and gums normal  Neck:   Supple, no lymphadenopathy;  thyroid:  no   enlargement/tenderness/nodules; no carotid   bruit or JVD     Lungs:     Clear to auscultation bilaterally without wheezes, rales or     ronchi; respirations unlabored      Heart:    Regular rate and rhythm, S1 and S2 normal, no murmur, rub   or gallop     Abdomen:     Soft, non-tender, nondistended, normoactive bowel sounds,    no masses, no hepatosplenomegaly  Genitalia:   Deferred  Rectal:   Deferred  Extremities:   No clubbing, cyanosis or edema  Pulses:   2+ and symmetric all extremities  Skin:   Skin color, texture, turgor normal, no rashes or lesions  Lymph nodes:   Cervical, supraclavicular, and axillary nodes normal  Neurologic:   CNII-XII intact, normal strength,  sensation and gait; reflexes 2+ and symmetric throughout          Psych:   Normal mood, affect, hygiene and grooming.    Urine microscopic was negative.      Assessment & Plan:  Routine general medical examination at a health care facility - Plan: CBC with Differential/Platelet, Comprehensive metabolic panel, Lipid panel, POCT Urinalysis DIP (Proadvantage Device)  Essential hypertension - Plan: amLODipine (NORVASC) 10 MG tablet, CBC with Differential/Platelet, Comprehensive metabolic panel  Hyperlipidemia with target LDL less than 100 - Plan: Lipid panel  Encounter for long-term (current) use of medications  Screening for prostate cancer - Plan: PSA  Encouraged him to become more physically active as well as make dietary changes specifically cutting back on carbohydrates.  I did increase his amlodipine since his blood pressure is not under good control.  He is post return here in 1 month for a recheck.  He was also placed on the Shingrix call list.

## 2017-08-06 LAB — CBC WITH DIFFERENTIAL/PLATELET
BASOS ABS: 0 10*3/uL (ref 0.0–0.2)
Basos: 0 %
EOS (ABSOLUTE): 0.2 10*3/uL (ref 0.0–0.4)
Eos: 2 %
HEMATOCRIT: 47.3 % (ref 37.5–51.0)
HEMOGLOBIN: 16.4 g/dL (ref 13.0–17.7)
Immature Grans (Abs): 0 10*3/uL (ref 0.0–0.1)
Immature Granulocytes: 0 %
LYMPHS ABS: 3.4 10*3/uL — AB (ref 0.7–3.1)
Lymphs: 43 %
MCH: 28.5 pg (ref 26.6–33.0)
MCHC: 34.7 g/dL (ref 31.5–35.7)
MCV: 82 fL (ref 79–97)
MONOCYTES: 8 %
MONOS ABS: 0.7 10*3/uL (ref 0.1–0.9)
NEUTROS ABS: 3.8 10*3/uL (ref 1.4–7.0)
Neutrophils: 47 %
Platelets: 168 10*3/uL (ref 150–450)
RBC: 5.75 x10E6/uL (ref 4.14–5.80)
RDW: 14.1 % (ref 12.3–15.4)
WBC: 8 10*3/uL (ref 3.4–10.8)

## 2017-08-06 LAB — COMPREHENSIVE METABOLIC PANEL
ALBUMIN: 4.6 g/dL (ref 3.6–4.8)
ALK PHOS: 84 IU/L (ref 39–117)
ALT: 27 IU/L (ref 0–44)
AST: 26 IU/L (ref 0–40)
Albumin/Globulin Ratio: 1.7 (ref 1.2–2.2)
BUN / CREAT RATIO: 11 (ref 10–24)
BUN: 15 mg/dL (ref 8–27)
Bilirubin Total: 0.6 mg/dL (ref 0.0–1.2)
CHLORIDE: 101 mmol/L (ref 96–106)
CO2: 26 mmol/L (ref 20–29)
Calcium: 9.9 mg/dL (ref 8.6–10.2)
Creatinine, Ser: 1.31 mg/dL — ABNORMAL HIGH (ref 0.76–1.27)
GFR calc Af Amer: 67 mL/min/{1.73_m2} (ref >59)
GFR calc non Af Amer: 58 mL/min/{1.73_m2} — ABNORMAL LOW (ref >59)
GLOBULIN, TOTAL: 2.7 g/dL (ref 1.5–4.5)
GLUCOSE: 109 mg/dL — AB (ref 65–99)
Potassium: 4.6 mmol/L (ref 3.5–5.2)
SODIUM: 144 mmol/L (ref 134–144)
Total Protein: 7.3 g/dL (ref 6.0–8.5)

## 2017-08-06 LAB — LIPID PANEL
CHOLESTEROL TOTAL: 184 mg/dL (ref 100–199)
Chol/HDL Ratio: 4.1 ratio (ref 0.0–5.0)
HDL: 45 mg/dL (ref >39)
LDL Calculated: 110 mg/dL — ABNORMAL HIGH (ref 0–99)
Triglycerides: 146 mg/dL (ref 0–149)
VLDL Cholesterol Cal: 29 mg/dL (ref 5–40)

## 2017-08-06 LAB — PSA: PROSTATE SPECIFIC AG, SERUM: 2.6 ng/mL (ref 0.0–4.0)

## 2017-09-09 ENCOUNTER — Encounter: Payer: Self-pay | Admitting: Family Medicine

## 2017-09-09 ENCOUNTER — Ambulatory Visit: Payer: BLUE CROSS/BLUE SHIELD | Admitting: Family Medicine

## 2017-09-09 VITALS — BP 138/80 | HR 64 | Temp 97.7°F | Wt 185.6 lb

## 2017-09-09 DIAGNOSIS — I10 Essential (primary) hypertension: Secondary | ICD-10-CM | POA: Diagnosis not present

## 2017-09-09 DIAGNOSIS — Z23 Encounter for immunization: Secondary | ICD-10-CM

## 2017-09-09 NOTE — Progress Notes (Signed)
   Subjective:    Patient ID: Jonathan Stone, male    DOB: 06/13/1955, 62 y.o.   MRN: [id number]  HPI He is here for recheck on his blood pressure.  He is presently on Norvasc 10 mg as well as Prinzide 20/12.5.  He is having no difficulty with that.   Review of Systems     Objective:   Physical Exam Alert and in no distress otherwise not examined       Assessment & Plan:  Need for shingles vaccine - Plan: Varicella-zoster vaccine IM (Shingrix)  Essential hypertension Blood pressure reading is definitely much better but not quite at goal however at this point I am not going put him on third medication. We will also give him his first Shingrix dose and have him back here in 2 months.

## 2017-11-18 ENCOUNTER — Ambulatory Visit: Payer: BLUE CROSS/BLUE SHIELD | Admitting: Family Medicine

## 2017-11-18 ENCOUNTER — Encounter: Payer: Self-pay | Admitting: Family Medicine

## 2017-11-18 VITALS — BP 150/84 | HR 62 | Temp 98.0°F | Wt 187.0 lb

## 2017-11-18 DIAGNOSIS — H52223 Regular astigmatism, bilateral: Secondary | ICD-10-CM | POA: Diagnosis not present

## 2017-11-18 DIAGNOSIS — I10 Essential (primary) hypertension: Secondary | ICD-10-CM | POA: Diagnosis not present

## 2017-11-18 DIAGNOSIS — Z23 Encounter for immunization: Secondary | ICD-10-CM

## 2017-11-18 DIAGNOSIS — H5203 Hypermetropia, bilateral: Secondary | ICD-10-CM | POA: Diagnosis not present

## 2017-11-18 DIAGNOSIS — H524 Presbyopia: Secondary | ICD-10-CM | POA: Diagnosis not present

## 2017-11-18 MED ORDER — AZILSARTAN-CHLORTHALIDONE 40-12.5 MG PO TABS: 1.0000 | ORAL_TABLET | Freq: Every day | ORAL | 11 refills | Status: DC

## 2017-11-18 NOTE — Progress Notes (Signed)
   Subjective:    Patient ID: Jonathan Stone, male    DOB: 31-Aug-1955, 62 y.o.   MRN: [id number]  HPI He is here for recheck he is on his blood pressure.  He has been taking his medications regularly and does keep himself quite physically active.  Review of Systems     Objective:   Physical Exam Alert and in no distress.  Blood pressure is recorded.       Assessment & Plan:  Essential hypertension - Plan: Azilsartan-Chlorthalidone (EDARBYCLOR) 40-12.5 MG TABS  Need for shingles vaccine - Plan: Varicella-zoster vaccine IM (Shingrix)  Need for influenza vaccination - Plan: Flu Vaccine QUAD 6+ mos PF IM (Fluarix Quad PF) Blood pressure still not under adequate control.  I will switch him to [employer].  He is to return here in 1 month.

## 2017-11-18 NOTE — Patient Instructions (Signed)
Stop the amlodipine and your lisinopril and start the new medication.  Recheck here in 1 month

## 2017-11-19 ENCOUNTER — Telehealth: Payer: Self-pay | Admitting: Medical

## 2017-11-19 NOTE — Telephone Encounter (Signed)
P.A. EDARBYCLOR  

## 2017-11-25 NOTE — Telephone Encounter (Signed)
P.A. Approved, went thru $10, pt informed

## 2017-12-16 ENCOUNTER — Encounter: Payer: Self-pay | Admitting: Family Medicine

## 2017-12-16 ENCOUNTER — Ambulatory Visit: Payer: BLUE CROSS/BLUE SHIELD | Admitting: Family Medicine

## 2017-12-16 VITALS — BP 148/92 | HR 77 | Temp 98.1°F | Wt 187.4 lb

## 2017-12-16 DIAGNOSIS — I10 Essential (primary) hypertension: Secondary | ICD-10-CM

## 2017-12-16 MED ORDER — AMLODIPINE BESYLATE 10 MG PO TABS: 10.0000 mg | ORAL_TABLET | Freq: Every day | ORAL | 3 refills | Status: DC

## 2017-12-16 NOTE — Progress Notes (Signed)
   Subjective:    Patient ID: Jonathan Stone, male    DOB: 12/21/1955, 62 y.o.   MRN: [id number]  HPI He is here for recheck.  He is now taking Edarbychlor.  He is having no difficulty with that.   Review of Systems     Objective:   Physical Exam Alert and in no distress.  Blood pressure is recorded.       Assessment & Plan:  Essential hypertension - Plan: amLODipine (NORVASC) 10 MG tablet I discussed options with him.  I will add amlodipine back to his regimen and keep him on the same dose of the Edarbychlor.  Recheck here in 1 month

## 2018-02-03 DIAGNOSIS — H40003 Preglaucoma, unspecified, bilateral: Secondary | ICD-10-CM | POA: Diagnosis not present

## 2018-02-10 ENCOUNTER — Encounter: Payer: Self-pay | Admitting: Family Medicine

## 2018-02-10 ENCOUNTER — Ambulatory Visit: Payer: BLUE CROSS/BLUE SHIELD | Admitting: Family Medicine

## 2018-02-10 DIAGNOSIS — I10 Essential (primary) hypertension: Secondary | ICD-10-CM | POA: Diagnosis not present

## 2018-02-10 MED ORDER — AMLODIPINE BESYLATE 10 MG PO TABS: 10.0000 mg | ORAL_TABLET | Freq: Every day | ORAL | 3 refills | Status: DC

## 2018-02-10 NOTE — Progress Notes (Signed)
   Subjective:    Patient ID: Jonathan Stone, male    DOB: [birth year], 62 y.o.   MRN: [id number]  HPI He is here for recheck.  He is now on Edarbychlor and amlodipine.  He is having no difficulty with that.   Review of Systems     Objective:   Physical Exam Alert and in no distress.  Blood pressure is recorded       Assessment & Plan:  Essential hypertension - Plan: amLODipine (NORVASC) 10 MG tablet I again stressed the need for him to maintain these medications, continue to work on exercise as well as dietary modification with the DASH diet.  Discussed the fact that with time his blood pressure will again need to be readjusted.

## 2018-12-11 ENCOUNTER — Other Ambulatory Visit: Payer: Self-pay

## 2018-12-11 ENCOUNTER — Ambulatory Visit: Payer: BC Managed Care – PPO | Admitting: Family Medicine

## 2018-12-11 ENCOUNTER — Encounter: Payer: Self-pay | Admitting: Family Medicine

## 2018-12-11 VITALS — BP 198/102 | HR 74 | Temp 96.0°F | Wt 188.6 lb

## 2018-12-11 DIAGNOSIS — Z79899 Other long term (current) drug therapy: Secondary | ICD-10-CM

## 2018-12-11 DIAGNOSIS — I10 Essential (primary) hypertension: Secondary | ICD-10-CM | POA: Diagnosis not present

## 2018-12-11 DIAGNOSIS — Z23 Encounter for immunization: Secondary | ICD-10-CM | POA: Diagnosis not present

## 2018-12-11 DIAGNOSIS — N529 Male erectile dysfunction, unspecified: Secondary | ICD-10-CM

## 2018-12-11 DIAGNOSIS — E785 Hyperlipidemia, unspecified: Secondary | ICD-10-CM

## 2018-12-11 DIAGNOSIS — Z1211 Encounter for screening for malignant neoplasm of colon: Secondary | ICD-10-CM

## 2018-12-11 LAB — COMPREHENSIVE METABOLIC PANEL WITH GFR
ALT: 22 [IU]/L (ref 0–44)
AST: 27 [IU]/L (ref 0–40)
Albumin/Globulin Ratio: 1.8 (ref 1.2–2.2)
Albumin: 4.2 g/dL (ref 3.8–4.8)
Alkaline Phosphatase: 106 [IU]/L (ref 39–117)
BUN/Creatinine Ratio: 14 (ref 10–24)
BUN: 18 mg/dL (ref 8–27)
Bilirubin Total: 0.4 mg/dL (ref 0.0–1.2)
CO2: 24 mmol/L (ref 20–29)
Calcium: 9.6 mg/dL (ref 8.6–10.2)
Chloride: 104 mmol/L (ref 96–106)
Creatinine, Ser: 1.29 mg/dL — ABNORMAL HIGH (ref 0.76–1.27)
GFR calc Af Amer: 68 mL/min/{1.73_m2}
GFR calc non Af Amer: 59 mL/min/{1.73_m2} — ABNORMAL LOW
Globulin, Total: 2.4 g/dL (ref 1.5–4.5)
Glucose: 125 mg/dL — ABNORMAL HIGH (ref 65–99)
Potassium: 4.7 mmol/L (ref 3.5–5.2)
Sodium: 141 mmol/L (ref 134–144)
Total Protein: 6.6 g/dL (ref 6.0–8.5)

## 2018-12-11 LAB — LIPID PANEL
Chol/HDL Ratio: 4.6 ratio (ref 0.0–5.0)
Cholesterol, Total: 184 mg/dL (ref 100–199)
HDL: 40 mg/dL
LDL Chol Calc (NIH): 101 mg/dL — ABNORMAL HIGH (ref 0–99)
Triglycerides: 249 mg/dL — ABNORMAL HIGH (ref 0–149)
VLDL Cholesterol Cal: 43 mg/dL — ABNORMAL HIGH (ref 5–40)

## 2018-12-11 LAB — CBC WITH DIFFERENTIAL/PLATELET
Basophils Absolute: 0 10*3/uL (ref 0.0–0.2)
Basos: 0 %
EOS (ABSOLUTE): 0.2 10*3/uL (ref 0.0–0.4)
Eos: 3 %
Hematocrit: 47.3 % (ref 37.5–51.0)
Hemoglobin: 15.7 g/dL (ref 13.0–17.7)
Immature Grans (Abs): 0 10*3/uL (ref 0.0–0.1)
Immature Granulocytes: 0 %
Lymphocytes Absolute: 3.5 10*3/uL — ABNORMAL HIGH (ref 0.7–3.1)
Lymphs: 45 %
MCH: 27.5 pg (ref 26.6–33.0)
MCHC: 33.2 g/dL (ref 31.5–35.7)
MCV: 83 fL (ref 79–97)
Monocytes Absolute: 0.7 10*3/uL (ref 0.1–0.9)
Monocytes: 9 %
Neutrophils Absolute: 3.3 10*3/uL (ref 1.4–7.0)
Neutrophils: 43 %
Platelets: 149 10*3/uL — ABNORMAL LOW (ref 150–450)
RBC: 5.7 x10E6/uL (ref 4.14–5.80)
RDW: 12.9 % (ref 11.6–15.4)
WBC: 7.7 10*3/uL (ref 3.4–10.8)

## 2018-12-11 MED ORDER — EDARBYCLOR 40-12.5 MG PO TABS: 1.0000 | ORAL_TABLET | Freq: Every day | ORAL | 11 refills | Status: DC

## 2018-12-11 MED ORDER — TADALAFIL 20 MG PO TABS: 20.0000 mg | ORAL_TABLET | Freq: Every day | ORAL | 3 refills | Status: DC | PRN

## 2018-12-11 MED ORDER — AMLODIPINE BESYLATE 10 MG PO TABS: 10.0000 mg | ORAL_TABLET | Freq: Every day | ORAL | 3 refills | Status: DC

## 2018-12-11 MED ORDER — ROSUVASTATIN CALCIUM 10 MG PO TABS: 10.0000 mg | ORAL_TABLET | Freq: Every day | ORAL | 3 refills | Status: DC

## 2018-12-11 NOTE — Progress Notes (Signed)
   Subjective:    Patient ID: Jonathan Stone, male    DOB: 1956/01/24, 63 y.o.   MRN: [id number]  HPI He is here for an interval evaluation.  He did not refill his medications when Covid hit and has therefore been off for several months.  He has had no headache, nosebleeds, weakness, numbness or tingling.  He does have an underlying history of hyperlipidemia and has stopped taking his Crestor as well.  He also has ED but has not been on any medications.  He would likely placed on them.  He otherwise has no particular concerns or complaints.  No blood work has been done in over a year.   Review of Systems     Objective:   Physical Exam Alert and in no distress.  Cardiac exam shows regular rhythm without murmurs or gallops.  Lungs are clear to auscultation.       Assessment & Plan:  Essential hypertension - Plan: Azilsartan-Chlorthalidone (EDARBYCLOR) 40-12.5 MG TABS, amLODipine (NORVASC) 10 MG tablet  Need for influenza vaccination - Plan: Flu Vaccine QUAD 6+ mos PF IM (Fluarix Quad PF)  Hyperlipidemia with target LDL less than 100 - Plan: Lipid panel, rosuvastatin (CRESTOR) 10 MG tablet  Encounter for long-term (current) use of medications - Plan: CBC with Differential/Platelet, Comprehensive metabolic panel, Lipid panel  Erectile dysfunction, unspecified erectile dysfunction type - Plan: tadalafil (CIALIS) 20 MG tablet  Need for Tdap vaccination - Plan: Tdap vaccine greater than or equal to 7yo IM  Screening for colon cancer - Plan: Cologuard His immunizations were updated.  I will draw a routine blood work.  Recommend he return here in 1 month and bring his blood pressure cuff to compare against ours.  Also recommend he come back for complete examination at his convenience.

## 2018-12-12 MED ORDER — ROSUVASTATIN CALCIUM 20 MG PO TABS: 20.0000 mg | ORAL_TABLET | Freq: Every day | ORAL | 3 refills | Status: DC

## 2018-12-12 NOTE — Addendum Note (Signed)
Addended by: Denita Lung on: 12/12/2018 12:40 PM   Modules accepted: Orders

## 2019-01-08 ENCOUNTER — Encounter: Payer: Self-pay | Admitting: Family Medicine

## 2019-01-08 ENCOUNTER — Other Ambulatory Visit: Payer: Self-pay

## 2019-01-08 ENCOUNTER — Ambulatory Visit: Payer: BC Managed Care – PPO | Admitting: Family Medicine

## 2019-01-08 DIAGNOSIS — I10 Essential (primary) hypertension: Secondary | ICD-10-CM | POA: Diagnosis not present

## 2019-01-08 MED ORDER — EDARBYCLOR 40-12.5 MG PO TABS: 1.0000 | ORAL_TABLET | Freq: Every day | ORAL | 11 refills | Status: DC

## 2019-01-08 NOTE — Progress Notes (Signed)
   Subjective:    Patient ID: Jonathan Stone, male    DOB: 08/08/1955, 63 y.o.   MRN: [id number]  HPI He is here for recheck on his blood pressure.  He has been taking this regularly.   Review of Systems     Objective:   Physical Exam Alert and in no distress.  Blood pressure is recorded.       Assessment & Plan:  Essential hypertension - Plan: Azilsartan-Chlorthalidone (EDARBYCLOR) 40-12.5 MG TABS He is to buy blood pressure cuff, compare to ours and set up a virtual appointment in 6 weeks to possibly readjust his medication.

## 2019-01-15 ENCOUNTER — Other Ambulatory Visit: Payer: Self-pay

## 2019-01-15 ENCOUNTER — Telehealth: Payer: Self-pay

## 2019-01-15 ENCOUNTER — Other Ambulatory Visit: Payer: BC Managed Care – PPO

## 2019-01-15 NOTE — Telephone Encounter (Signed)
Have him check his blood pressure weekly and set up a virtual visit with me in about a month

## 2019-01-15 NOTE — Telephone Encounter (Signed)
Pt was advised and appt was made. KH °

## 2019-01-15 NOTE — Telephone Encounter (Signed)
Pt came in for a b/p check and brought his machine. Our reading was 158/84 and his pulse was 70. Pt machine reading was 166/88 and pulse was 74. Pt was advise to subtract the difference.Please advise of any other info and return date for b/p check . [place]

## 2019-01-18 ENCOUNTER — Telehealth: Payer: Self-pay

## 2019-01-18 DIAGNOSIS — Z1211 Encounter for screening for malignant neoplasm of colon: Secondary | ICD-10-CM

## 2019-01-18 NOTE — Telephone Encounter (Signed)
Pt was of sample not being processed. Pt says he is will to repeat . New oreder was put in. [facility]

## 2019-02-12 ENCOUNTER — Encounter: Payer: Self-pay | Admitting: Family Medicine

## 2019-02-12 ENCOUNTER — Ambulatory Visit (INDEPENDENT_AMBULATORY_CARE_PROVIDER_SITE_OTHER): Payer: BC Managed Care – PPO | Admitting: Family Medicine

## 2019-02-12 ENCOUNTER — Other Ambulatory Visit: Payer: Self-pay

## 2019-02-12 VITALS — BP 146/82 | HR 69 | Temp 98.4°F | Wt 189.0 lb

## 2019-02-12 DIAGNOSIS — I10 Essential (primary) hypertension: Secondary | ICD-10-CM

## 2019-02-12 NOTE — Progress Notes (Signed)
   Subjective:    Patient ID: Jonathan Stone, male    DOB: March 26, 1955, 63 y.o.   MRN: [id number]  HPI He is here for follow-up concerning his blood pressure.  He does check his blood pressure at home and states that on average it is below 130/80.  He is starting to become more physically active.   Review of Systems     Objective:   Physical Exam Alert and in no distress.  Blood pressure is recorded.       Assessment & Plan:  Essential hypertension It does look like he has whitecoat hypertension.  Reinforced the need to check his blood pressure monthly and continue with physical activity.

## 2019-02-19 ENCOUNTER — Encounter: Payer: Self-pay | Admitting: Family Medicine

## 2019-02-19 ENCOUNTER — Other Ambulatory Visit: Payer: Self-pay

## 2019-02-19 ENCOUNTER — Other Ambulatory Visit: Payer: Self-pay | Admitting: Family Medicine

## 2019-02-19 ENCOUNTER — Ambulatory Visit: Payer: BC Managed Care – PPO | Admitting: Family Medicine

## 2019-02-19 VITALS — BP 136/79 | Wt 189.0 lb

## 2019-02-19 DIAGNOSIS — I10 Essential (primary) hypertension: Secondary | ICD-10-CM | POA: Diagnosis not present

## 2019-02-19 DIAGNOSIS — Z1211 Encounter for screening for malignant neoplasm of colon: Secondary | ICD-10-CM

## 2019-02-19 NOTE — Progress Notes (Signed)
   Subjective:    Patient ID: Jonathan Stone, male    DOB: 07-12-1955, 64 y.o.   MRN: [id number]  HPI Documentation for virtual telephone encounter.  Documentation for virtual audio and video telecommunications through [place]  encounter: The patient was located at home. The provider was located in the office. The patient did consent to this visit and is aware of possible charges through their insurance for this visit The other persons participating in this telemedicine service were none. This virtual service is not related to other E/M service within previous 7 days. He did check his blood pressure at home and it was 136/79.     Review of Systems     Objective:   Physical Exam  Alert and in no distress.  Blood pressure is recorded.      Assessment & Plan:  Essential hypertension He does indeed look like he has a component of whitecoat hypertension.  I recommend that he check his blood pressure weekly and set up a virtual visit in 1 month.  Explained that I would look at all of them and pay attention to the average blood pressure.

## 2019-03-29 ENCOUNTER — Other Ambulatory Visit: Payer: Self-pay

## 2019-03-29 DIAGNOSIS — Z1211 Encounter for screening for malignant neoplasm of colon: Secondary | ICD-10-CM

## 2019-04-04 ENCOUNTER — Encounter: Payer: Self-pay | Admitting: Family Medicine

## 2019-05-01 ENCOUNTER — Encounter: Payer: Self-pay | Admitting: Family Medicine

## 2019-07-04 ENCOUNTER — Encounter: Payer: Self-pay | Admitting: Internal Medicine

## 2019-08-31 ENCOUNTER — Other Ambulatory Visit: Payer: Self-pay

## 2019-08-31 ENCOUNTER — Ambulatory Visit (AMBULATORY_SURGERY_CENTER): Payer: Self-pay | Admitting: *Deleted

## 2019-08-31 ENCOUNTER — Encounter: Payer: Self-pay | Admitting: Internal Medicine

## 2019-08-31 VITALS — Ht 65.0 in | Wt 195.0 lb

## 2019-08-31 DIAGNOSIS — Z1211 Encounter for screening for malignant neoplasm of colon: Secondary | ICD-10-CM

## 2019-08-31 MED ORDER — SUTAB 1479-225-188 MG PO TABS: 1.0000 | ORAL_TABLET | ORAL | 0 refills | Status: DC

## 2019-08-31 NOTE — Progress Notes (Signed)
Patient is here in-person for PV. Patient denies any allergies to eggs or soy. Patient denies any problems with anesthesia/sedation. Patient denies any oxygen use at home. Patient denies taking any diet/weight loss medications or blood thinners. Patient is not being treated for MRSA or C-diff. Patient is aware of our care-partner policy and ZZCKI-21 safety protocol.COVID-19 vaccines completed on 06/04/19. Prep Prescription coupon given to the patient.

## 2019-09-14 ENCOUNTER — Encounter: Payer: Self-pay | Admitting: Internal Medicine

## 2019-09-14 ENCOUNTER — Other Ambulatory Visit: Payer: Self-pay

## 2019-09-14 ENCOUNTER — Ambulatory Visit (AMBULATORY_SURGERY_CENTER): Payer: BC Managed Care – PPO | Admitting: Internal Medicine

## 2019-09-14 VITALS — BP 115/65 | HR 72 | Temp 98.1°F | Resp 17 | Ht 65.0 in | Wt 195.0 lb

## 2019-09-14 DIAGNOSIS — D124 Benign neoplasm of descending colon: Secondary | ICD-10-CM

## 2019-09-14 DIAGNOSIS — D122 Benign neoplasm of ascending colon: Secondary | ICD-10-CM | POA: Diagnosis not present

## 2019-09-14 DIAGNOSIS — Z86010 Personal history of colonic polyps: Secondary | ICD-10-CM

## 2019-09-14 DIAGNOSIS — D125 Benign neoplasm of sigmoid colon: Secondary | ICD-10-CM | POA: Diagnosis not present

## 2019-09-14 DIAGNOSIS — Z1211 Encounter for screening for malignant neoplasm of colon: Secondary | ICD-10-CM | POA: Diagnosis not present

## 2019-09-14 MED ORDER — SODIUM CHLORIDE 0.9 % IV SOLN: 500.0000 mL | INTRAVENOUS | Status: DC

## 2019-09-14 NOTE — Progress Notes (Signed)
No problems noted in the recovery room. maw 

## 2019-09-14 NOTE — Progress Notes (Signed)
[place] relieves [employer]

## 2019-09-14 NOTE — Progress Notes (Signed)
Vs CW I have reviewed the patient's medical history in detail and updated the computerized patient record.   

## 2019-09-14 NOTE — Progress Notes (Signed)
Called to room to assist during endoscopic procedure.  Patient ID and intended procedure confirmed with present staff. Received instructions for my participation in the procedure from the performing physician.  

## 2019-09-14 NOTE — Op Note (Signed)
[facility] Patient Name: Jonathan Stone Procedure Date: 09/14/2019 10:53 AM MRN: [id number] Endoscopist: Docia Chuck. Henrene Pastor , MD Age: 64 Referring MD:  Date of Birth: 1955/09/26 Gender: Male Account #: [id number] Procedure:                Colonoscopy with cold snare polypectomy x 3 Indications:              High risk colon cancer surveillance: Personal                            history of non-advanced adenomas. Index exam 2012.                            Diminutive adenomas and incidental hamartomatous                            polyp moved Medicines:                Monitored Anesthesia Care Procedure:                Pre-Anesthesia Assessment:                           - Prior to the procedure, a History and Physical                            was performed, and patient medications and                            allergies were reviewed. The patient's tolerance of                            previous anesthesia was also reviewed. The risks                            and benefits of the procedure and the sedation                            options and risks were discussed with the patient.                            All questions were answered, and informed consent                            was obtained. Prior Anticoagulants: The patient has                            taken no previous anticoagulant or antiplatelet                            agents. ASA Grade Assessment: II - A patient with                            mild systemic disease. After reviewing the risks  and benefits, the patient was deemed in                            satisfactory condition to undergo the procedure.                           After obtaining informed consent, the colonoscope                            was passed under direct vision. Throughout the                            procedure, the patient's blood pressure, pulse, and                            oxygen saturations were  monitored continuously. The                            Colonoscope was introduced through the anus and                            advanced to the the cecum, identified by                            appendiceal orifice and ileocecal valve. The                            ileocecal valve, appendiceal orifice, and rectum                            were photographed. The quality of the bowel                            preparation was excellent. The colonoscopy was                            performed without difficulty. The patient tolerated                            the procedure well. The bowel preparation used was                            SUPREP via split dose instruction. Scope In: 11:06:34 AM Scope Out: 11:15:44 AM Scope Withdrawal Time: 0 hours 7 minutes 1 second  Total Procedure Duration: 0 hours 9 minutes 10 seconds  Findings:                 Three polyps were found in the sigmoid colon,                            descending colon and ascending colon. The polyps                            were 2 to 4 mm in size. These polyps were removed  with a cold snare. Resection and retrieval were                            complete.                           Multiple diverticula were found in the left colon                            and right colon.. Small internal hemorrhoids.                           The exam was otherwise without abnormality on                            direct and retroflexion views. Complications:            No immediate complications. Estimated blood loss:                            None. Estimated Blood Loss:     Estimated blood loss: none. Impression:               - Three 2 to 4 mm polyps in the sigmoid colon, in                            the descending colon and in the ascending colon,                            removed with a cold snare. Resected and retrieved.                           - Diverticulosis in the left colon and in the  right                            colon.                           - The examination was otherwise normal on direct                            and retroflexion views. Recommendation:           - Repeat colonoscopy in 5 years for surveillance.                           - Patient has a contact number available for                            emergencies. The signs and symptoms of potential                            delayed complications were discussed with the                            patient. Return to  normal activities tomorrow.                            Written discharge instructions were provided to the                            patient.                           - Resume previous diet.                           - Continue present medications. Docia Chuck. Henrene Pastor, MD 09/14/2019 11:24:57 AM This report has been signed electronically.

## 2019-09-14 NOTE — Patient Instructions (Addendum)
Handouts were given to you on polyps, diverticulosis, and a high fiber diet with liberal fluid intake. You may resume your current medications today. Await biopsy results. Please call if any questions or concerns.     YOU HAD AN ENDOSCOPIC PROCEDURE TODAY AT [facility]:   Refer to the procedure report that was given to you for any specific questions about what was found during the examination.  If the procedure report does not answer your questions, please call your gastroenterologist to clarify.  If you requested that your care partner not be given the details of your procedure findings, then the procedure report has been included in a sealed envelope for you to review at your convenience later.  YOU SHOULD EXPECT: Some feelings of bloating in the abdomen. Passage of more gas than usual.  Walking can help get rid of the air that was put into your GI tract during the procedure and reduce the bloating. If you had a lower endoscopy (such as a colonoscopy or flexible sigmoidoscopy) you may notice spotting of blood in your stool or on the toilet paper. If you underwent a bowel prep for your procedure, you may not have a normal bowel movement for a few days.  Please Note:  You might notice some irritation and congestion in your nose or some drainage.  This is from the oxygen used during your procedure.  There is no need for concern and it should clear up in a day or so.  SYMPTOMS TO REPORT IMMEDIATELY:   Following lower endoscopy (colonoscopy or flexible sigmoidoscopy):  Excessive amounts of blood in the stool  Significant tenderness or worsening of abdominal pains  Swelling of the abdomen that is new, acute  Fever of 100F or higher    For urgent or emergent issues, a gastroenterologist can be reached at any hour by calling [phone]. Do not use MyChart messaging for urgent concerns.    DIET:  We do recommend a small meal at first, but then you may proceed to your  regular diet.  Drink plenty of fluids but you should avoid alcoholic beverages for 24 hours.  ACTIVITY:  You should plan to take it easy for the rest of today and you should NOT DRIVE or use heavy machinery until tomorrow (because of the sedation medicines used during the test).    FOLLOW UP: Our staff will call the number listed on your records 48-72 hours following your procedure to check on you and address any questions or concerns that you may have regarding the information given to you following your procedure. If we do not reach you, we will leave a message.  We will attempt to reach you two times.  During this call, we will ask if you have developed any symptoms of COVID 19. If you develop any symptoms (ie: fever, flu-like symptoms, shortness of breath, cough etc.) before then, please call [phone].  If you test positive for Covid 19 in the 2 weeks post procedure, please call and report this information to [place].    If any biopsies were taken you will be contacted by phone or by letter within the next 1-3 weeks.  Please call us at [phone] if you have not heard about the biopsies in 3 weeks.    SIGNATURES/CONFIDENTIALITY: You and/or your care partner have signed paperwork which will be entered into your electronic medical record.  These signatures attest to the fact that that the information above on your After Visit Summary has  been reviewed and is understood.  Full responsibility of the confidentiality of this discharge information lies with you and/or your care-partner.

## 2019-09-14 NOTE — Progress Notes (Signed)
A/ox3, pleased with MAC, report to RN 

## 2019-09-18 ENCOUNTER — Telehealth: Payer: Self-pay | Admitting: *Deleted

## 2019-09-18 ENCOUNTER — Encounter: Payer: Self-pay | Admitting: Internal Medicine

## 2019-09-18 NOTE — Telephone Encounter (Signed)
Patient returned the call states he is well and had no questions at this time 

## 2019-09-18 NOTE — Telephone Encounter (Signed)
  Follow up Call-  Call back number 09/14/2019  Post procedure Call Back phone  # [phone]  Permission to leave phone message Yes  Some recent data might be hidden     Patient questions:  Do you have a fever, pain , or abdominal swelling? No. Pain Score  0 *  Have you tolerated food without any problems? Yes.    Have you been able to return to your normal activities? Yes.    Do you have any questions about your discharge instructions: Diet   No. Medications  No. Follow up visi t  No.  Do you have questions or concerns about your Care? No.  Actions: * If pain score is 4 or above: No action needed, pain <4.   1. Have you developed a fever since your procedure? no  2.   Have you had an respiratory symptoms (SOB or cough) since your procedure? no  3.   Have you tested positive for COVID 19 since your procedure no  4.   Have you had any family members/close contacts diagnosed with the COVID 19 since your procedure?  no   If yes to any of these questions please route to Joylene John, RN and Erenest Rasher, RN

## 2019-09-18 NOTE — Telephone Encounter (Signed)
No answer for post procedure callback. Left message and will call back later today. 

## 2020-02-15 ENCOUNTER — Other Ambulatory Visit: Payer: Self-pay | Admitting: Family Medicine

## 2020-02-15 DIAGNOSIS — I10 Essential (primary) hypertension: Secondary | ICD-10-CM

## 2021-04-20 ENCOUNTER — Encounter: Payer: Self-pay | Admitting: Physician Assistant

## 2021-04-20 ENCOUNTER — Ambulatory Visit (INDEPENDENT_AMBULATORY_CARE_PROVIDER_SITE_OTHER): Payer: 59 | Admitting: Physician Assistant

## 2021-04-20 VITALS — BP 172/108 | HR 84 | Ht 65.0 in | Wt 192.8 lb

## 2021-04-20 DIAGNOSIS — I10 Essential (primary) hypertension: Secondary | ICD-10-CM | POA: Insufficient documentation

## 2021-04-20 DIAGNOSIS — Z23 Encounter for immunization: Secondary | ICD-10-CM

## 2021-04-20 DIAGNOSIS — I152 Hypertension secondary to endocrine disorders: Secondary | ICD-10-CM | POA: Insufficient documentation

## 2021-04-20 DIAGNOSIS — E782 Mixed hyperlipidemia: Secondary | ICD-10-CM

## 2021-04-20 DIAGNOSIS — R7301 Impaired fasting glucose: Secondary | ICD-10-CM | POA: Insufficient documentation

## 2021-04-20 DIAGNOSIS — Z833 Family history of diabetes mellitus: Secondary | ICD-10-CM | POA: Insufficient documentation

## 2021-04-20 DIAGNOSIS — Z8249 Family history of ischemic heart disease and other diseases of the circulatory system: Secondary | ICD-10-CM | POA: Insufficient documentation

## 2021-04-20 MED ORDER — AMLODIPINE BESYLATE 10 MG PO TABS: 10.0000 mg | ORAL_TABLET | Freq: Every day | ORAL | 1 refills | Status: DC

## 2021-04-20 MED ORDER — EDARBYCLOR 40-12.5 MG PO TABS: 1.0000 | ORAL_TABLET | Freq: Every day | ORAL | 1 refills | Status: DC

## 2021-04-20 MED ORDER — ROSUVASTATIN CALCIUM 20 MG PO TABS: 20.0000 mg | ORAL_TABLET | Freq: Every day | ORAL | 1 refills | Status: DC

## 2021-04-20 NOTE — Patient Instructions (Signed)
Eat a low salt diet and minimize processed foods (examples canned vegetables, lunch meats, fast food), drink 8 glasses of water a day, exercise 3 - 5 times a week (example walking 30 minutes daily), decrease alcohol intake, decrease smoking or nicotine use, decrease caffeine intake, check blood pressure at home every other day and write it down. ? ?For high cholesterol - Increase fiber intake (Benefiber or Metamucil, Cherrios,  oatmeal, beans, nuts, fruits and vegetables), limit saturated fats (in fried foods, red meat), can add OTC fish oil supplement, eat fish with Omega-3 fatty acids like salmon and tuna, exercise for 30 minutes 3 - 5 times a week, drink 8 - 10 glasses of water a day ? ? ?

## 2021-04-20 NOTE — Progress Notes (Signed)
Acute Office Visit  Subjective:    Patient ID: Jonathan Stone, male    DOB: [birth year], 66 y.o.   MRN: [id number]  Chief Complaint  Patient presents with   Hypertension    Blood pressure high. No other concerns    Hypertension Pertinent negatives include no neck pain or shortness of breath.   Patient is in today for a follow up appointment. 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      Past Medical History:  Diagnosis Date   Dyslipidemia    Hyperlipidemia LDL goal < 100 01/29/2011   Hyperlipidemia with target LDL less than 100 01/29/2011   Hypertension    Obesity     Past Surgical History:  Procedure Laterality Date   COLONOSCOPY  2010   Dr.perry   POLYPECTOMY      Family History  Problem Relation Age of Onset   Diabetes Mother    Colon cancer Neg Hx    Colon polyps Neg Hx    Esophageal cancer Neg Hx    Rectal cancer Neg Hx    Stomach cancer Neg Hx     Social History   Socioeconomic History   Marital status: Single    Spouse name: Not on file   Number of children: Not on file   Years of education: Not on file   Highest education level: Not on file  Occupational History   Not on file  Tobacco Use   Smoking status: Never   Smokeless tobacco: Never  Vaping Use   Vaping Use: Never used  Substance and Sexual Activity   Alcohol use: No   Drug use: No   Sexual activity: Yes    Birth control/protection: Condom  Other Topics Concern   Not on file  Social History Narrative   Not on file   Social Determinants of Health   Financial Resource Strain: Not on file  Food Insecurity: Not on file   Transportation Needs: Not on file  Physical Activity: Not on file  Stress: Not on file  Social Connections: Not on file  Intimate Partner Violence: Not on file    Outpatient Medications Prior to Visit  Medication Sig Dispense Refill   Sodium Sulfate-Mag Sulfate-KCl (SUTAB) [phone] MG TABS Take 1 kit by mouth as directed. 24 tablet 0   tadalafil (CIALIS) 20 MG tablet Take 1 tablet (20 mg total) by mouth daily as needed for erectile dysfunction. 30 tablet 3   amLODipine (NORVASC) 10 MG tablet Take 1 tablet (10 mg total) by mouth daily. 90 tablet 3   EDARBYCLOR 40-12.5 MG TABS TAKE 1 TABLET ONCE DAILY. 30 tablet 0   rosuvastatin (CRESTOR) 20 MG tablet Take 1 tablet (20 mg total) by mouth daily. 90 tablet 3   No facility-administered medications prior to visit.    No Known Allergies  Review of Systems  Constitutional:  Negative for activity change, chills, fatigue and fever.  HENT:  Negative for congestion, ear pain, hearing loss and voice change.   Eyes:  Negative for pain and redness.  Respiratory:  Negative for cough and shortness of breath.   Cardiovascular:  Negative  for leg swelling.  Gastrointestinal:  Negative for constipation, diarrhea, nausea and vomiting.  Endocrine: Negative for polyuria.  Genitourinary:  Negative for flank pain and frequency.  Musculoskeletal:  Negative for joint swelling and neck pain.  Skin:  Negative for rash.  Neurological:  Negative for dizziness.  Hematological:  Does not bruise/bleed easily.  Psychiatric/Behavioral:  Negative for agitation and behavioral problems.       Objective:    Physical Exam Vitals and nursing note reviewed.  Constitutional:      General: He is not in acute distress.    Appearance: Normal appearance.  HENT:     Head: Normocephalic and atraumatic.     Right Ear: External ear normal.     Left Ear: External ear normal.     Nose: No congestion.  Eyes:     Extraocular Movements: Extraocular movements intact.      Conjunctiva/sclera: Conjunctivae normal.     Pupils: Pupils are equal, round, and reactive to light.  Cardiovascular:     Rate and Rhythm: Normal rate and regular rhythm.     Pulses: Normal pulses.     Heart sounds: Normal heart sounds.  Pulmonary:     Effort: Pulmonary effort is normal.     Breath sounds: Normal breath sounds. No wheezing.  Abdominal:     General: Bowel sounds are normal.     Palpations: Abdomen is soft.  Musculoskeletal:        General: Normal range of motion.     Cervical back: Normal range of motion and neck supple.     Right lower leg: No edema.     Left lower leg: No edema.  Skin:    General: Skin is warm and dry.     Findings: No rash.  Neurological:     Mental Status: He is alert and oriented to person, place, and time.     Gait: Gait normal.  Psychiatric:        Mood and Affect: Mood normal.        Behavior: Behavior normal.    BP (!) 172/108    Pulse 84    Wt 192 lb 12.8 oz (87.5 kg)    SpO2 97%    BMI 32.08 kg/m   BP Readings from Last 5 Encounters:  04/20/21 (!) 172/108  09/14/19 115/65  02/19/19 136/79  02/12/19 (!) 146/82  01/15/19 (!) 166/88    Wt Readings from Last 3 Encounters:  04/20/21 192 lb 12.8 oz (87.5 kg)  09/14/19 195 lb (88.5 kg)  08/31/19 195 lb (88.5 kg)    Health Maintenance Due  Topic Date Due   COVID-19 Vaccine (3 - Moderna risk series) 04/20/2021    There are no preventive care reminders to display for this patient.   No results found for: TSH Lab Results  Component Value Date   WBC 7.7 12/11/2018   HGB 15.7 12/11/2018   HCT 47.3 12/11/2018   MCV 83 12/11/2018   PLT 149 (L) 12/11/2018   Lab Results  Component Value Date   NA 141 12/11/2018   K 4.7 12/11/2018   CO2 24 12/11/2018   GLUCOSE 125 (H) 12/11/2018   BUN 18 12/11/2018   CREATININE 1.29 (H) 12/11/2018   BILITOT 0.4 12/11/2018   ALKPHOS 106 12/11/2018   AST 27 12/11/2018   ALT 22 12/11/2018   PROT 6.6 12/11/2018   ALBUMIN 4.2 12/11/2018    CALCIUM 9.6 12/11/2018   Lab Results  Component Value Date   CHOL 184  12/11/2018   Lab Results  Component Value Date   HDL 40 12/11/2018   Lab Results  Component Value Date   LDLCALC 101 (H) 12/11/2018   Lab Results  Component Value Date   TRIG 249 (H) 12/11/2018   Lab Results  Component Value Date   CHOLHDL 4.6 12/11/2018   No results found for: HGBA1C     Assessment & Plan:   Problem List Items Addressed This Visit       Cardiovascular and Mediastinum   Primary hypertension - Primary   Relevant Medications   amLODipine (NORVASC) 10 MG tablet   Azilsartan-Chlorthalidone (EDARBYCLOR) 40-12.5 MG TABS   rosuvastatin (CRESTOR) 20 MG tablet   Other Relevant Orders   CBC with Differential/Platelet   Comprehensive metabolic panel   Lipid panel     Other   Mixed hyperlipidemia   Relevant Medications   amLODipine (NORVASC) 10 MG tablet   Azilsartan-Chlorthalidone (EDARBYCLOR) 40-12.5 MG TABS   rosuvastatin (CRESTOR) 20 MG tablet   Other Relevant Orders   Lipid panel   Elevated fasting glucose   Relevant Orders   Hemoglobin A1c   Other Visit Diagnoses     Need for COVID-19 vaccine       Relevant Orders   Moderna Covid-19 Vaccine Bivalent Booster (Completed)        Meds ordered this encounter  Medications   amLODipine (NORVASC) 10 MG tablet    Sig: Take 1 tablet (10 mg total) by mouth daily.    Dispense:  90 tablet    Refill:  1    Order Specific Question:   Supervising Provider    Answer:   Denita Lung [[id number]]   Azilsartan-Chlorthalidone (EDARBYCLOR) 40-12.5 MG TABS    Sig: Take 1 tablet by mouth daily.    Dispense:  90 tablet    Refill:  1    Order Specific Question:   Supervising Provider    Answer:   Denita Lung [[id number]]   rosuvastatin (CRESTOR) 20 MG tablet    Sig: Take 1 tablet (20 mg total) by mouth daily.    Dispense:  90 tablet    Refill:  1    Order Specific Question:   Supervising Provider    Answer:   Denita Lung [[id number]]    Re-start HTN and HLD medicines; Return in 4 weeks to see Dr. Redmond School; If your symptoms get worse, please call 911 / EMS for help or go to the Emergency Department for further evaluation.   Irene Pap, PA-C

## 2021-04-21 LAB — COMPREHENSIVE METABOLIC PANEL
ALT: 33 IU/L (ref 0–44)
AST: 24 IU/L (ref 0–40)
Albumin/Globulin Ratio: 2.3 — ABNORMAL HIGH (ref 1.2–2.2)
Albumin: 4.9 g/dL — ABNORMAL HIGH (ref 3.8–4.8)
Alkaline Phosphatase: 91 IU/L (ref 44–121)
BUN/Creatinine Ratio: 13 (ref 10–24)
BUN: 16 mg/dL (ref 8–27)
Bilirubin Total: 0.4 mg/dL (ref 0.0–1.2)
CO2: 27 mmol/L (ref 20–29)
Calcium: 10.6 mg/dL — ABNORMAL HIGH (ref 8.6–10.2)
Chloride: 102 mmol/L (ref 96–106)
Creatinine, Ser: 1.21 mg/dL (ref 0.76–1.27)
Globulin, Total: 2.1 g/dL (ref 1.5–4.5)
Glucose: 129 mg/dL — ABNORMAL HIGH (ref 70–99)
Potassium: 5.4 mmol/L — ABNORMAL HIGH (ref 3.5–5.2)
Sodium: 146 mmol/L — ABNORMAL HIGH (ref 134–144)
Total Protein: 7 g/dL (ref 6.0–8.5)
eGFR: 66 mL/min/{1.73_m2} (ref >59)

## 2021-04-21 LAB — CBC WITH DIFFERENTIAL/PLATELET
Basophils Absolute: 0 10*3/uL (ref 0.0–0.2)
Basos: 0 %
EOS (ABSOLUTE): 0.1 10*3/uL (ref 0.0–0.4)
Eos: 2 %
Hematocrit: 50.6 % (ref 37.5–51.0)
Hemoglobin: 16.5 g/dL (ref 13.0–17.7)
Immature Grans (Abs): 0 10*3/uL (ref 0.0–0.1)
Immature Granulocytes: 0 %
Lymphocytes Absolute: 3.2 10*3/uL — ABNORMAL HIGH (ref 0.7–3.1)
Lymphs: 35 %
MCH: 27.3 pg (ref 26.6–33.0)
MCHC: 32.6 g/dL (ref 31.5–35.7)
MCV: 84 fL (ref 79–97)
Monocytes Absolute: 0.8 10*3/uL (ref 0.1–0.9)
Monocytes: 8 %
Neutrophils Absolute: 5 10*3/uL (ref 1.4–7.0)
Neutrophils: 55 %
Platelets: 174 10*3/uL (ref 150–450)
RBC: 6.05 x10E6/uL — ABNORMAL HIGH (ref 4.14–5.80)
RDW: 13.2 % (ref 11.6–15.4)
WBC: 9.2 10*3/uL (ref 3.4–10.8)

## 2021-04-21 LAB — HEMOGLOBIN A1C
Est. average glucose Bld gHb Est-mCnc: 169 mg/dL
Hgb A1c MFr Bld: 7.5 % — ABNORMAL HIGH (ref 4.8–5.6)

## 2021-04-21 LAB — LIPID PANEL
Chol/HDL Ratio: 4.7 ratio (ref 0.0–5.0)
Cholesterol, Total: 207 mg/dL — ABNORMAL HIGH (ref 100–199)
HDL: 44 mg/dL (ref >39)
LDL Chol Calc (NIH): 115 mg/dL — ABNORMAL HIGH (ref 0–99)
Triglycerides: 276 mg/dL — ABNORMAL HIGH (ref 0–149)
VLDL Cholesterol Cal: 48 mg/dL — ABNORMAL HIGH (ref 5–40)

## 2021-04-23 NOTE — Progress Notes (Signed)
Please call patient to tell him- ? ?Average blood sugar levels are too high. Please keep already scheduled appointment with Dr. Redmond School to discuss type 2 diabetes. Eat a low sugar, low carbohydrate diet. ? ?Kidneys and liver ae fine. ? ?Blood counts are fine. ? ?Cholesterol levels are too high. For high cholesterol - Increase fiber intake (Benefiber or Metamucil, Cherrios,  oatmeal, beans, nuts, fruits and vegetables), limit saturated fats (in fried foods, red meat), can add OTC fish oil supplement, eat fish with Omega-3 fatty acids like salmon and tuna, exercise for 30 minutes 3 - 5 times a week, drink 8 - 10 glasses of water a day ? ?

## 2021-05-18 ENCOUNTER — Ambulatory Visit: Payer: 59 | Admitting: Family Medicine

## 2021-05-18 ENCOUNTER — Encounter: Payer: Self-pay | Admitting: Family Medicine

## 2021-05-18 VITALS — BP 140/66 | HR 66 | Temp 96.8°F | Ht 66.0 in | Wt 189.8 lb

## 2021-05-18 DIAGNOSIS — Z6830 Body mass index (BMI) 30.0-30.9, adult: Secondary | ICD-10-CM | POA: Diagnosis not present

## 2021-05-18 DIAGNOSIS — E6609 Other obesity due to excess calories: Secondary | ICD-10-CM | POA: Diagnosis not present

## 2021-05-18 DIAGNOSIS — I10 Essential (primary) hypertension: Secondary | ICD-10-CM

## 2021-05-18 DIAGNOSIS — E782 Mixed hyperlipidemia: Secondary | ICD-10-CM | POA: Diagnosis not present

## 2021-05-18 MED ORDER — AMLODIPINE BESYLATE 10 MG PO TABS: 10.0000 mg | ORAL_TABLET | Freq: Every day | ORAL | 3 refills | Status: DC

## 2021-05-18 MED ORDER — ROSUVASTATIN CALCIUM 20 MG PO TABS: 20.0000 mg | ORAL_TABLET | Freq: Every day | ORAL | 3 refills | Status: DC

## 2021-05-18 MED ORDER — EDARBYCLOR 40-12.5 MG PO TABS: 1.0000 | ORAL_TABLET | Freq: Every day | ORAL | 3 refills | Status: DC

## 2021-05-18 NOTE — Progress Notes (Signed)
? ?  Subjective:  ? ? Patient ID: Jonathan Stone, male    DOB: [birth year], 66 y.o.   MRN: [id number] ? ?HPI ?He is here for recheck.  He is not taking his medications regularly.  He is having trouble getting a 90-day supply from the pharmacy. ? ? ?Review of Systems ? ?   ?Objective:  ? Physical Exam ?Alert and in no distress.  Blood pressure is recorded. ? ? ? ?   ?Assessment & Plan:  ? ?Class 1 obesity due to excess calories with body mass index (BMI) of 30.0 to 30.9 in adult, unspecified whether serious comorbidity present ? ?Mixed hyperlipidemia - Plan: rosuvastatin (CRESTOR) 20 MG tablet ? ?Primary hypertension - Plan: Azilsartan-Chlorthalidone (EDARBYCLOR) 40-12.5 MG TABS, amLODipine (NORVASC) 10 MG tablet ?His medications were renewed.  He will check with the pharmacy to see why they could not give a 90-day supply.  He might possibly need to switch to a different pharmacy to get company. ?

## 2021-08-21 ENCOUNTER — Encounter: Payer: Self-pay | Admitting: Family Medicine

## 2021-11-16 ENCOUNTER — Ambulatory Visit: Payer: 59 | Admitting: Family Medicine

## 2021-11-19 ENCOUNTER — Encounter: Payer: Self-pay | Admitting: Family Medicine

## 2022-02-26 ENCOUNTER — Telehealth: Payer: Self-pay | Admitting: Family Medicine

## 2022-02-26 NOTE — Telephone Encounter (Signed)
P.A. Carole Binning

## 2022-03-02 MED ORDER — EDARBYCLOR 40-12.5 MG PO TABS: 1.0000 | ORAL_TABLET | Freq: Every day | ORAL | 3 refills | Status: DC

## 2022-03-02 NOTE — Telephone Encounter (Signed)
P.A. denied, pt must try 2 formulary alternatives losartan/HCTZ, olmesarta/HCTZ, Valsartan/HCTZ.  Pt hasn't tried either of these.  Called pt and asked if he wanted to switch or we can send Rx to [facility] mail order and pay $45 for 30 days or $120 for 90 & he wants to stay with Edarbyclor since it's working well for him.  Sent Rx there & told pt to call if hasn't heard from them in a few days

## 2022-04-29 ENCOUNTER — Telehealth: Payer: Self-pay

## 2022-04-29 NOTE — Progress Notes (Signed)
   04/29/2022  Patient ID: Jonathan Stone, male   DOB: Apr 21, 1955, 67 y.o.   MRN: [id number] Patient appearing on report for [facility] - Hypertension Control report due to last documented ambulatory blood pressure of 140/66 on 05/18/21. Next appointment with PCP is 05/04/22   Outreached patient to discuss hypertension control and medication management, but patient was out of town and unable to schedule telephone visit to discuss prior to his PCP appointment.  I did ask that he bring active medications to appointment Tuesday to discuss medication management.  He also verified he has been able to get and continue taking Edarbyclor.  Darlina Guys, PharmD, DPLA

## 2022-05-04 ENCOUNTER — Ambulatory Visit: Payer: 59 | Admitting: Family Medicine

## 2022-05-04 ENCOUNTER — Encounter: Payer: Self-pay | Admitting: Family Medicine

## 2022-05-04 ENCOUNTER — Telehealth: Payer: Self-pay | Admitting: Family Medicine

## 2022-05-04 VITALS — BP 166/70 | HR 81 | Temp 98.2°F | Resp 16 | Wt 192.2 lb

## 2022-05-04 DIAGNOSIS — E782 Mixed hyperlipidemia: Secondary | ICD-10-CM

## 2022-05-04 DIAGNOSIS — E1159 Type 2 diabetes mellitus with other circulatory complications: Secondary | ICD-10-CM | POA: Diagnosis not present

## 2022-05-04 DIAGNOSIS — Z23 Encounter for immunization: Secondary | ICD-10-CM

## 2022-05-04 DIAGNOSIS — E785 Hyperlipidemia, unspecified: Secondary | ICD-10-CM

## 2022-05-04 DIAGNOSIS — E6609 Other obesity due to excess calories: Secondary | ICD-10-CM

## 2022-05-04 DIAGNOSIS — I152 Hypertension secondary to endocrine disorders: Secondary | ICD-10-CM

## 2022-05-04 DIAGNOSIS — Z6830 Body mass index (BMI) 30.0-30.9, adult: Secondary | ICD-10-CM

## 2022-05-04 DIAGNOSIS — E1169 Type 2 diabetes mellitus with other specified complication: Secondary | ICD-10-CM | POA: Diagnosis not present

## 2022-05-04 DIAGNOSIS — E1165 Type 2 diabetes mellitus with hyperglycemia: Secondary | ICD-10-CM

## 2022-05-04 DIAGNOSIS — N529 Male erectile dysfunction, unspecified: Secondary | ICD-10-CM

## 2022-05-04 DIAGNOSIS — I10 Essential (primary) hypertension: Secondary | ICD-10-CM

## 2022-05-04 LAB — LIPID PANEL

## 2022-05-04 LAB — POCT GLYCOSYLATED HEMOGLOBIN (HGB A1C): Hemoglobin A1C: 10 % — AB (ref 4.0–5.6)

## 2022-05-04 MED ORDER — ONETOUCH ULTRA 2 W/DEVICE KIT: PACK | 0 refills | Status: DC

## 2022-05-04 MED ORDER — METOPROLOL SUCCINATE ER 50 MG PO TB24: 50.0000 mg | ORAL_TABLET | Freq: Every day | ORAL | 3 refills | Status: DC

## 2022-05-04 MED ORDER — ONETOUCH ULTRA VI STRP: ORAL_STRIP | 12 refills | Status: DC

## 2022-05-04 MED ORDER — ONETOUCH ULTRASOFT LANCETS MISC: 12 refills | Status: DC

## 2022-05-04 MED ORDER — EDARBYCLOR 40-12.5 MG PO TABS: 1.0000 | ORAL_TABLET | Freq: Every day | ORAL | 3 refills | Status: DC

## 2022-05-04 MED ORDER — ROSUVASTATIN CALCIUM 20 MG PO TABS: 20.0000 mg | ORAL_TABLET | Freq: Every day | ORAL | 3 refills | Status: DC

## 2022-05-04 MED ORDER — DAPAGLIFLOZIN PROPANEDIOL 5 MG PO TABS: 5.0000 mg | ORAL_TABLET | Freq: Every day | ORAL | 1 refills | Status: DC

## 2022-05-04 MED ORDER — TADALAFIL 20 MG PO TABS: 20.0000 mg | ORAL_TABLET | Freq: Every day | ORAL | 3 refills | Status: DC | PRN

## 2022-05-04 MED ORDER — AMLODIPINE BESYLATE 10 MG PO TABS: 10.0000 mg | ORAL_TABLET | Freq: Every day | ORAL | 3 refills | Status: DC

## 2022-05-04 MED ORDER — METFORMIN HCL 500 MG PO TABS: 500.0000 mg | ORAL_TABLET | Freq: Two times a day (BID) | ORAL | 1 refills | Status: DC

## 2022-05-04 NOTE — Telephone Encounter (Signed)
Prescriptions send to pharmacy as requested.

## 2022-05-04 NOTE — Patient Instructions (Signed)
Go to the American diabetes Association website and get educated  Diabetes Mellitus Basics  Diabetes mellitus, or diabetes, is a long-term (chronic) disease. It occurs when the body does not properly use sugar (glucose) that is released from food after you eat. Diabetes mellitus may be caused by one or both of these problems: Your pancreas does not make enough of a hormone called insulin. Your body does not react in a normal way to the insulin that it makes. Insulin lets glucose enter cells in your body. This gives you energy. If you have diabetes, glucose cannot get into cells. This causes high blood glucose (hyperglycemia). How to treat and manage diabetes You may need to take insulin or other diabetes medicines daily to keep your glucose in balance. If you are prescribed insulin, you will learn how to give yourself insulin by injection. You may need to adjust the amount of insulin you take based on the foods that you eat. You will need to check your blood glucose levels using a glucose monitor as told by your health care provider. The readings can help determine if you have low or high blood glucose. Generally, you should have these blood glucose levels: Before meals (preprandial): 80-130 mg/dL (4.4-7.2 mmol/L). After meals (postprandial): below 180 mg/dL (10 mmol/L). Hemoglobin A1c (HbA1c) level: less than 7%. Your health care provider will set treatment goals for you. Keep all follow-up visits. This is important. Follow these instructions at home: Diabetes medicines Take your diabetes medicines every day as told by your health care provider. List your diabetes medicines here: Name of medicine: ______________________________ Amount (dose): _______________ Time (a.m./p.m.): _______________ Notes: ___________________________________ Name of medicine: ______________________________ Amount (dose): _______________ Time (a.m./p.m.): _______________ Notes:  ___________________________________ Name of medicine: ______________________________ Amount (dose): _______________ Time (a.m./p.m.): _______________ Notes: ___________________________________ Insulin If you use insulin, list the types of insulin you use here: Insulin type: ______________________________ Amount (dose): _______________ Time (a.m./p.m.): _______________Notes: ___________________________________ Insulin type: ______________________________ Amount (dose): _______________ Time (a.m./p.m.): _______________ Notes: ___________________________________ Insulin type: ______________________________ Amount (dose): _______________ Time (a.m./p.m.): _______________ Notes: ___________________________________ Insulin type: ______________________________ Amount (dose): _______________ Time (a.m./p.m.): _______________ Notes: ___________________________________ Insulin type: ______________________________ Amount (dose): _______________ Time (a.m./p.m.): _______________ Notes: ___________________________________ Managing blood glucose  Check your blood glucose levels using a glucose monitor as told by your health care provider. Write down the times that you check your glucose levels here: Time: _______________ Notes: ___________________________________ Time: _______________ Notes: ___________________________________ Time: _______________ Notes: ___________________________________ Time: _______________ Notes: ___________________________________ Time: _______________ Notes: ___________________________________ Time: _______________ Notes: ___________________________________  Low blood glucose Low blood glucose (hypoglycemia) is when glucose is at or below 70 mg/dL (3.9 mmol/L). Symptoms may include: Feeling: Hungry. Sweaty and clammy. Irritable or easily upset. Dizzy. Sleepy. Having: A fast heartbeat. A headache. A change in your vision. Numbness around the mouth, lips, or  tongue. Having trouble with: Moving (coordination). Sleeping. Treating low blood glucose To treat low blood glucose, eat or drink something containing sugar right away. If you can think clearly and swallow safely, follow the 15:15 rule: Take 15 grams of a fast-acting carb (carbohydrate), as told by your health care provider. Some fast-acting carbs are: Glucose tablets: take 3-4 tablets. Hard candy: eat 3-5 pieces. Fruit juice: drink 4 oz (120 mL). Regular (not diet) soda: drink 4-6 oz (120-180 mL). Honey or sugar: eat 1 Tbsp (15 mL). Check your blood glucose levels 15 minutes after you take the carb. If your glucose is still at or below 70 mg/dL (3.9 mmol/L), take 15 grams of a carb again. If  your glucose does not go above 70 mg/dL (3.9 mmol/L) after 3 tries, get help right away. After your glucose goes back to normal, eat a meal or a snack within 1 hour. Treating very low blood glucose If your glucose is at or below 54 mg/dL (3 mmol/L), you have very low blood glucose (severe hypoglycemia). This is an emergency. Do not wait to see if the symptoms will go away. Get medical help right away. Call your local emergency services (911 in the [place] Do not drive yourself to the hospital. Questions to ask your health care provider Should I talk with a diabetes educator? What equipment will I need to care for myself at home? What diabetes medicines do I need? When should I take them? How often do I need to check my blood glucose levels? What number can I call if I have questions? When is my follow-up visit? Where can I find a support group for people with diabetes? Where to find more information American Diabetes Association: www.diabetes.org Association of Diabetes Care and Education Specialists: www.diabeteseducator.org Contact a health care provider if: Your blood glucose is at or above 240 mg/dL (13.3 mmol/L) for 2 days in a row. You have been sick or have had a fever for 2 days or more,  and you are not getting better. You have any of these problems for more than 6 hours: You cannot eat or drink. You feel nauseous. You vomit. You have diarrhea. Get help right away if: Your blood glucose is lower than 54 mg/dL (3 mmol/L). You get confused. You have trouble thinking clearly. You have trouble breathing. These symptoms may represent a serious problem that is an emergency. Do not wait to see if the symptoms will go away. Get medical help right away. Call your local emergency services (911 in the [place] Do not drive yourself to the hospital. Summary Diabetes mellitus is a chronic disease that occurs when the body does not properly use sugar (glucose) that is released from food after you eat. Take insulin and diabetes medicines as told. Check your blood glucose every day, as often as told. Keep all follow-up visits. This is important. This information is not intended to replace advice given to you by your health care provider. Make sure you discuss any questions you have with your health care provider. Document Revised: 06/05/2019 Document Reviewed: 06/05/2019 Elsevier Patient Education  [place].

## 2022-05-04 NOTE — Telephone Encounter (Signed)
Pt called and me and him both talked to the [employer] and they cover the one touch, freestyle, and the dexcom g 7  Please send to the [facility] ([place]), [place]

## 2022-05-04 NOTE — Progress Notes (Signed)
Subjective:    Patient ID: Jonathan Stone, male    DOB: 10-13-1955, 67 y.o.   MRN: [id number]  Jonathan Stone is a 67 y.o. male who presents for follow-up of Type 2 diabetes mellitus.  Home blood sugar records:  blood sugars are not checked Current symptoms/problems include none and have been stable. Daily foot checks: no   Any foot concerns: no How often blood sugars checked: never Exercise: Home exercise routine includes walking. Diet: well balanced He did not follow-up as instructed approximately 1 year ago.  He does exercise regularly.  Does not smoke or drink.  He continues on Edarbyclor and amlodipine for his blood pressure.  He is also taking Crestor and having no difficulty with that.  He would like a refill on his Cialis. The following portions of the patient's history were reviewed and updated as appropriate: allergies, current medications, past medical history, past social history and problem list.  ROS as in subjective above.     Objective:    Physical Exam Alert and in no distress. Tympanic membranes and canals are normal. Pharyngeal area is normal. Neck is supple without adenopathy or thyromegaly. Cardiac exam shows a regular sinus rhythm without murmurs or gallops. Lungs are clear to auscultation. Hemoglobin A1c 10.0 Blood pressure (!) 162/62, pulse 81, temperature 98.2 F (36.8 C), temperature source Oral, resp. rate 16, weight 192 lb 3.2 oz (87.2 kg), SpO2 98 %.  Lab Review    Latest Ref Rng & Units 04/20/2021    3:44 PM 12/11/2018    8:49 AM 08/05/2017   10:09 AM 09/22/2015    2:50 PM 08/23/2014   12:01 AM  Diabetic Labs  HbA1c 4.8 - 5.6 % 7.5       Chol 100 - 199 mg/dL 207  184  184  163  129   HDL >39 mg/dL 44  40  45  48  44   Calc LDL 0 - 99 mg/dL 115  101  110  99  67   Triglycerides 0 - 149 mg/dL 276  249  146  80  92   Creatinine 0.76 - 1.27 mg/dL 1.21  1.29  1.31  1.12        05/04/2022    8:31 AM 05/18/2021    8:11 AM 04/20/2021    3:42 PM 04/20/2021    3:15  PM 09/14/2019   11:43 AM  BP/Weight  Systolic BP 0000000 [id number]  Diastolic BP 62 66 [id number] 65  Wt. (Lbs) 192.2 189.8  192.8   BMI 31.02 kg/m2 30.63 kg/m2  32.08 kg/m2        No data to display          Jonathan Stone  reports that he has never smoked. He has never used smokeless tobacco. He reports that he does not drink alcohol and does not use drugs.     Assessment & Plan:    Type 2 diabetes mellitus with hyperglycemia, without long-term current use of insulin (HCC) - Plan: POCT glycosylated hemoglobin (Hb A1C)  Class 1 obesity due to excess calories with body mass index (BMI) of 30.0 to 30.9 in adult, unspecified whether serious comorbidity present  Hypertension associated with diabetes ([place])  Hyperlipidemia associated with type 2 diabetes mellitus ([place])  I discussed diabetes in regards to diet, exercise, medications, exercise.  Discussed this in relation to stroke, blindness, heart failure, kidney failure.  Information concerning the diabetes given to him and recommend that he go to the [employer]  diabetes Association website.  Discussed the new medications as well as a blood pressure medicine and possible side effects.  He is to return here in 1 month for recheck.  Over 50 minutes spent discussing these issues with him.

## 2022-05-05 LAB — COMPREHENSIVE METABOLIC PANEL
ALT: 28 IU/L (ref 0–44)
AST: 26 IU/L (ref 0–40)
Albumin/Globulin Ratio: 1.7 (ref 1.2–2.2)
Albumin: 4.5 g/dL (ref 3.9–4.9)
Alkaline Phosphatase: 104 IU/L (ref 44–121)
BUN/Creatinine Ratio: 12 (ref 10–24)
BUN: 14 mg/dL (ref 8–27)
Bilirubin Total: 0.5 mg/dL (ref 0.0–1.2)
CO2: 25 mmol/L (ref 20–29)
Calcium: 9.6 mg/dL (ref 8.6–10.2)
Chloride: 99 mmol/L (ref 96–106)
Creatinine, Ser: 1.17 mg/dL (ref 0.76–1.27)
Globulin, Total: 2.6 g/dL (ref 1.5–4.5)
Glucose: 253 mg/dL — ABNORMAL HIGH (ref 70–99)
Potassium: 4 mmol/L (ref 3.5–5.2)
Sodium: 140 mmol/L (ref 134–144)
Total Protein: 7.1 g/dL (ref 6.0–8.5)
eGFR: 69 mL/min/{1.73_m2} (ref >59)

## 2022-05-05 LAB — CBC WITH DIFFERENTIAL/PLATELET
Basophils Absolute: 0 10*3/uL (ref 0.0–0.2)
Basos: 0 %
EOS (ABSOLUTE): 0.2 10*3/uL (ref 0.0–0.4)
Eos: 2 %
Hematocrit: 46.6 % (ref 37.5–51.0)
Hemoglobin: 15.5 g/dL (ref 13.0–17.7)
Immature Grans (Abs): 0 10*3/uL (ref 0.0–0.1)
Immature Granulocytes: 0 %
Lymphocytes Absolute: 3.1 10*3/uL (ref 0.7–3.1)
Lymphs: 41 %
MCH: 27.4 pg (ref 26.6–33.0)
MCHC: 33.3 g/dL (ref 31.5–35.7)
MCV: 83 fL (ref 79–97)
Monocytes Absolute: 0.6 10*3/uL (ref 0.1–0.9)
Monocytes: 8 %
Neutrophils Absolute: 3.6 10*3/uL (ref 1.4–7.0)
Neutrophils: 49 %
Platelets: 154 10*3/uL (ref 150–450)
RBC: 5.65 x10E6/uL (ref 4.14–5.80)
RDW: 13 % (ref 11.6–15.4)
WBC: 7.5 10*3/uL (ref 3.4–10.8)

## 2022-05-05 LAB — LIPID PANEL
Chol/HDL Ratio: 3.3 ratio (ref 0.0–5.0)
Cholesterol, Total: 130 mg/dL (ref 100–199)
HDL: 39 mg/dL — ABNORMAL LOW (ref >39)
LDL Chol Calc (NIH): 65 mg/dL (ref 0–99)
Triglycerides: 148 mg/dL (ref 0–149)
VLDL Cholesterol Cal: 26 mg/dL (ref 5–40)

## 2022-06-08 ENCOUNTER — Encounter: Payer: Self-pay | Admitting: Family Medicine

## 2022-06-08 ENCOUNTER — Ambulatory Visit (INDEPENDENT_AMBULATORY_CARE_PROVIDER_SITE_OTHER): Payer: 59 | Admitting: Family Medicine

## 2022-06-08 VITALS — BP 160/90 | HR 63 | Temp 97.6°F | Resp 16 | Wt 187.8 lb

## 2022-06-08 DIAGNOSIS — E1159 Type 2 diabetes mellitus with other circulatory complications: Secondary | ICD-10-CM

## 2022-06-08 DIAGNOSIS — E1165 Type 2 diabetes mellitus with hyperglycemia: Secondary | ICD-10-CM

## 2022-06-08 DIAGNOSIS — I152 Hypertension secondary to endocrine disorders: Secondary | ICD-10-CM

## 2022-06-08 MED ORDER — METOPROLOL SUCCINATE ER 100 MG PO TB24: 100.0000 mg | ORAL_TABLET | Freq: Every day | ORAL | 1 refills | Status: DC

## 2022-06-08 NOTE — Progress Notes (Signed)
Subjective:    Patient ID: Jonathan Stone, male    DOB: Apr 23, 1955, 67 y.o.   MRN: [id number]  Jonathan Stone is a 67 y.o. male who presents for follow-up of Type 2 diabetes mellitus.  Home blood sugar records:  blood sugars are checked, but patient cannot remember the average readings   he states that his wife checks his blood sugar but he does not know what the numbers are. Current symptoms/problems include none and have been stable. Daily foot checks: yes   Any foot concerns: no How often blood sugars checked: once a week Exercise: The patient does not participate in regular exercise at present.  Diet: regular diet He is now taking metformin and Farxiga but having no difficulty with that.  He also has Edarbychlor, amlodipine and was started on Toprol on his last visit.  The following portions of the patient's history were reviewed and updated as appropriate: allergies, current medications, past medical history, past social history and problem list.  ROS as in subjective above.     Objective:    Physical Exam Alert and in no distress otherwise not examined.  Blood pressure (!) 160/90, pulse 63, temperature 97.6 F (36.4 C), temperature source Oral, resp. rate 16, weight 187 lb 12.8 oz (85.2 kg), SpO2 98 %.  Lab Review    Latest Ref Rng & Units 05/04/2022    9:29 AM 05/04/2022    8:44 AM 04/20/2021    3:44 PM 12/11/2018    8:49 AM 08/05/2017   10:09 AM  Diabetic Labs  HbA1c 4.0 - 5.6 %  10.0  7.5     Chol 100 - 199 mg/dL 829   562  130  865   HDL >39 mg/dL 39   44  40  45   Calc LDL 0 - 99 mg/dL 65   784  696  295   Triglycerides 0 - 149 mg/dL 284   132  440  102   Creatinine 0.76 - 1.27 mg/dL 7.25   3.66  4.40  3.47       06/08/2022    8:28 AM 06/08/2022    8:23 AM 05/04/2022    8:41 AM 05/04/2022    8:31 AM 05/18/2021    8:11 AM  BP/Weight  Systolic BP 160 150 166 162 140  Diastolic BP 90 96 70 62 66  Wt. (Lbs)  187.8  192.2 189.8  BMI  30.31 kg/m2  31.02 kg/m2 30.63 kg/m2        No data to display          Jonathan Stone  reports that he has never smoked. He has never used smokeless tobacco. He reports that he does not drink alcohol and does not use drugs.     Assessment & Plan:    Hypertension associated with diabetes - Plan: metoprolol succinate (TOPROL-XL) 100 MG 24 hr tablet  Type 2 diabetes mellitus with hyperglycemia, without long-term current use of insulin His blood pressure is not at goal. Rx changes:  Toprol 100 mg Education: Reviewed 'ABCs' of diabetes management (respective goals in parentheses):  A1C (<7), blood pressure (<130/80), and cholesterol (LDL <100). Compliance at present is estimated to be fair. Efforts to improve compliance (if necessary) will be directed at .regular blood sugar monitoring: Discussed to check it either before a meal or 2 hours after a meal.  He should not rely on his wife to tell him what the numbers are. Follow up: 3 months Encouraged him to always bring all  of his medications in

## 2022-06-08 NOTE — Patient Instructions (Signed)
Check your blood sugar either before a meal or 2 hours after a meal 

## 2022-09-07 ENCOUNTER — Ambulatory Visit: Payer: 59 | Admitting: Family Medicine

## 2022-09-07 ENCOUNTER — Encounter: Payer: Self-pay | Admitting: Family Medicine

## 2022-09-07 VITALS — BP 190/92 | HR 81 | Temp 97.9°F | Resp 16 | Wt 188.0 lb

## 2022-09-07 DIAGNOSIS — E1165 Type 2 diabetes mellitus with hyperglycemia: Secondary | ICD-10-CM

## 2022-09-07 DIAGNOSIS — E1159 Type 2 diabetes mellitus with other circulatory complications: Secondary | ICD-10-CM | POA: Diagnosis not present

## 2022-09-07 DIAGNOSIS — E1169 Type 2 diabetes mellitus with other specified complication: Secondary | ICD-10-CM

## 2022-09-07 DIAGNOSIS — E785 Hyperlipidemia, unspecified: Secondary | ICD-10-CM

## 2022-09-07 DIAGNOSIS — Z6830 Body mass index (BMI) 30.0-30.9, adult: Secondary | ICD-10-CM

## 2022-09-07 DIAGNOSIS — I152 Hypertension secondary to endocrine disorders: Secondary | ICD-10-CM

## 2022-09-07 DIAGNOSIS — E6609 Other obesity due to excess calories: Secondary | ICD-10-CM | POA: Diagnosis not present

## 2022-09-07 LAB — POCT GLYCOSYLATED HEMOGLOBIN (HGB A1C): Hemoglobin A1C: 8.1 % — AB (ref 4.0–5.6)

## 2022-09-07 MED ORDER — METOPROLOL SUCCINATE ER 100 MG PO TB24: 100.0000 mg | ORAL_TABLET | Freq: Every day | ORAL | 3 refills | Status: DC

## 2022-09-07 MED ORDER — METFORMIN HCL 500 MG PO TABS
500.0000 mg | ORAL_TABLET | Freq: Two times a day (BID) | ORAL | 1 refills | Status: DC
Start: 2022-09-07 — End: 2023-07-05

## 2022-09-07 MED ORDER — DAPAGLIFLOZIN PROPANEDIOL 5 MG PO TABS
5.0000 mg | ORAL_TABLET | Freq: Every day | ORAL | 1 refills | Status: DC
Start: 2022-09-07 — End: 2023-07-05

## 2022-09-07 MED ORDER — METOPROLOL SUCCINATE ER 50 MG PO TB24: 50.0000 mg | ORAL_TABLET | Freq: Every day | ORAL | 3 refills | Status: DC

## 2022-09-07 NOTE — Progress Notes (Signed)
  Subjective:    Patient ID: Jonathan Stone, male    DOB: 1955-10-23, 67 y.o.   MRN: [id number]  Jonathan Stone is a 67 y.o. male who presents for follow-up of Type 2 diabetes mellitus.  Home blood sugar records: fasting range: 98 average Current symptoms/problems include none and have been stable. Daily foot checks: yes   Any foot concerns: no How often blood sugars checked: 2 times a week Exercise: Home exercise routine includes walking daily. Diet:regular diet He continues on metoprolol, amlodipine and Edarbi chlor for his blood pressure.  He is also taking [place] and metformin.  Continues on Crestor. The following portions of the patient's history were reviewed and updated as appropriate: allergies, current medications, past medical history, past social history and problem list.  ROS as in subjective above.     Objective:    Physical Exam Alert and in no distress otherwise not examined. Hemoglobin A1c is 8.1 Lab Review    Latest Ref Rng & Units 05/04/2022    9:29 AM 05/04/2022    8:44 AM 04/20/2021    3:44 PM 12/11/2018    8:49 AM 08/05/2017   10:09 AM  Diabetic Labs  HbA1c 4.0 - 5.6 %  10.0  7.5     Chol 100 - 199 mg/dL 782   956  213  086   HDL >39 mg/dL 39   44  40  45   Calc LDL 0 - 99 mg/dL 65   578  469  629   Triglycerides 0 - 149 mg/dL 528   413  244  010   Creatinine 0.76 - 1.27 mg/dL 2.72   5.36  6.44  0.34       06/08/2022    8:28 AM 06/08/2022    8:23 AM 05/04/2022    8:41 AM 05/04/2022    8:31 AM 05/18/2021    8:11 AM  BP/Weight  Systolic BP 160 150 166 162 140  Diastolic BP 90 96 70 62 66  Wt. (Lbs)  187.8  192.2 189.8  BMI  30.31 kg/m2  31.02 kg/m2 30.63 kg/m2       No data to display          Millard  reports that he has never smoked. He has never used smokeless tobacco. He reports that he does not drink alcohol and does not use drugs.     Assessment & Plan:    Type 2 diabetes mellitus with hyperglycemia, without long-term current use of insulin  (HCC)  Hypertension associated with diabetes (HCC)  Class 1 obesity due to excess calories with body mass index (BMI) of 30.0 to 30.9 in adult, unspecified whether serious comorbidity present  Hyperlipidemia associated with type 2 diabetes mellitus (HCC)  I discussed options with him concerning better care for his blood pressure and diabetes.  I will increase his metoprolol to get his pressure down.  Discussed options with him and I will refer him to [employer] for possible inclusion into research study on a very promising drug.  He will let me know whether he is going to join it or not otherwise I will add a GLP-1 to his regimen.

## 2022-11-15 ENCOUNTER — Other Ambulatory Visit: Payer: Self-pay

## 2022-11-15 LAB — HM DIABETES EYE EXAM

## 2022-11-15 NOTE — Progress Notes (Signed)
11/15/2022 Name: Jonathan Stone MRN: [id number] DOB: [birth year]  Chief Complaint  Patient presents with   Hypertension   Medication Management    Jonathan Stone is a 67 y.o. year old male who presented for a telephone visit.   They were referred to the pharmacist by their PCP for assistance in managing hypertension and complex medication management.    Subjective:  Care Team: Primary Care Provider: Ronnald Nian, MD ; Next Scheduled Visit: 03/08/23  Medication Access/Adherence  Current Pharmacy:  [facility] [address] ([place]), [place] - 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   Azilsartan-Chlorthalidone (EDARBYCLOR) 40-12.5 MG TABS Take 1 tablet by mouth daily.   metoprolol succinate (TOPROL-XL) 100 MG 24 hr tablet Take 1 tablet (100 mg total) by mouth daily. Take with or immediately following a meal.   metoprolol succinate (TOPROL-XL) 50 MG 24 hr tablet Take 1 tablet (50 mg total) by mouth daily. Take with or immediately following a meal.   tadalafil (CIALIS) 20 MG tablet Take 1 tablet (20 mg total) by mouth daily as needed for erectile dysfunction.       Medications previously tried: Lisinopril/HCTZ  Patient has a validated, automated, upper arm  home BP cuff Current blood pressure readings readings: 165/85 today, just resumed checking today and restarted his medications after being without for over a week  Patient denies hypotensive s/sx including dizziness, lightheadedness.  Patient denies hypertensive symptoms including headache, chest pain, shortness of breath   Objective:  Lab Results  Component Value Date   HGBA1C 8.1 (A) 09/07/2022    Lab Results  Component Value Date   CREATININE 1.17 05/04/2022   BUN 14 05/04/2022   NA 140 05/04/2022   K 4.0 05/04/2022   CL 99 05/04/2022   CO2 25 05/04/2022    Lab Results  Component Value Date   CHOL 130 05/04/2022   HDL 39 (L) 05/04/2022   LDLCALC 65 05/04/2022   TRIG 148 05/04/2022   CHOLHDL 3.3 05/04/2022    Medications Reviewed Today     Reviewed by Sherrill Raring, RPH (Pharmacist) on 11/15/22 at 0906  Med List Status: <None>   Medication Order Taking? Sig Documenting Provider Last Dose Status Informant  amLODipine (NORVASC) 10 MG tablet [id number] No Take 1 tablet (10 mg total) by mouth daily. Ronnald Nian, MD Taking Active   Azilsartan-Chlorthalidone (EDARBYCLOR) 40-12.5 MG TABS [id number] No Take 1 tablet by mouth daily. Ronnald Nian, MD Taking Active   Blood Glucose Monitoring Suppl (ONE TOUCH ULTRA 2) w/Device Andria Rhein [id number] No Use as directed Ronnald Nian, MD Taking Active   dapagliflozin propanediol (FARXIGA) 5 MG TABS tablet [id number]  Take 1 tablet (5 mg total) by mouth daily before breakfast. Ronnald Nian,  MD  Active   glucose blood (ONETOUCH ULTRA) test strip [id number] No Use as instructed Ronnald Nian, MD Taking Active   Lancets [facility] ULTRASOFT) lancets [id number] No Use as instructed Ronnald Nian, MD Taking Active   metFORMIN (GLUCOPHAGE) 500 MG tablet [id number]  Take 1 tablet (500 mg total) by mouth 2 (two) times daily with a meal. Ronnald Nian, MD  Active   metoprolol succinate (TOPROL-XL) 100 MG 24 hr tablet [id number]  Take 1  tablet (100 mg total) by mouth daily. Take with or immediately following a meal. Ronnald Nian, MD  Active   metoprolol succinate (TOPROL-XL) 50 MG 24 hr tablet [id number]  Take 1 tablet (50 mg total) by mouth daily. Take with or immediately following a meal. Ronnald Nian, MD  Active   rosuvastatin (CRESTOR) 20 MG tablet [id number] No Take 1 tablet (20 mg total) by mouth daily. Ronnald Nian, MD Taking Active   tadalafil (CIALIS) 20 MG tablet [id number] No Take 1 tablet (20 mg total) by mouth daily as needed for erectile dysfunction. Ronnald Nian, MD Taking Active               Assessment/Plan:   Hypertension: - Currently uncontrolled - Reviewed long term cardiovascular and renal outcomes of uncontrolled blood pressure - Reviewed appropriate blood pressure monitoring technique and reviewed goal blood pressure. Recommended to check home blood pressure and heart rate daily - Recommend to limit salt intake and counseled on importance of medication adherence and to keep his medications with him as he travels     Follow Up Plan: 2 weeks  Sherrill Raring, PharmD Clinical Pharmacist [phone]

## 2022-11-17 ENCOUNTER — Emergency Department (HOSPITAL_BASED_OUTPATIENT_CLINIC_OR_DEPARTMENT_OTHER): Payer: Self-pay

## 2022-11-17 ENCOUNTER — Encounter (HOSPITAL_BASED_OUTPATIENT_CLINIC_OR_DEPARTMENT_OTHER): Payer: Self-pay | Admitting: Pediatrics

## 2022-11-17 ENCOUNTER — Emergency Department (HOSPITAL_BASED_OUTPATIENT_CLINIC_OR_DEPARTMENT_OTHER)
Admission: EM | Admit: 2022-11-17 | Discharge: 2022-11-17 | Disposition: A | Discharge status: Home / Self Care | Payer: Self-pay | Attending: Emergency Medicine | Admitting: Emergency Medicine

## 2022-11-17 ENCOUNTER — Other Ambulatory Visit: Payer: Self-pay

## 2022-11-17 DIAGNOSIS — Z7984 Long term (current) use of oral hypoglycemic drugs: Secondary | ICD-10-CM | POA: Insufficient documentation

## 2022-11-17 DIAGNOSIS — S8991XA Unspecified injury of right lower leg, initial encounter: Secondary | ICD-10-CM | POA: Insufficient documentation

## 2022-11-17 DIAGNOSIS — M7989 Other specified soft tissue disorders: Secondary | ICD-10-CM | POA: Insufficient documentation

## 2022-11-17 DIAGNOSIS — I159 Secondary hypertension, unspecified: Secondary | ICD-10-CM | POA: Diagnosis not present

## 2022-11-17 DIAGNOSIS — E119 Type 2 diabetes mellitus without complications: Secondary | ICD-10-CM | POA: Insufficient documentation

## 2022-11-17 DIAGNOSIS — I10 Essential (primary) hypertension: Secondary | ICD-10-CM | POA: Insufficient documentation

## 2022-11-17 DIAGNOSIS — Z79899 Other long term (current) drug therapy: Secondary | ICD-10-CM | POA: Diagnosis not present

## 2022-11-17 DIAGNOSIS — W228XXA Striking against or struck by other objects, initial encounter: Secondary | ICD-10-CM | POA: Insufficient documentation

## 2022-11-17 DIAGNOSIS — Y92812 Truck as the place of occurrence of the external cause: Secondary | ICD-10-CM | POA: Diagnosis not present

## 2022-11-17 MED ORDER — LIDOCAINE 5 % EX PTCH
1.0000 | MEDICATED_PATCH | CUTANEOUS | Status: DC
  Administered 2022-11-17: 1 via TRANSDERMAL
  Filled 2022-11-17: qty 1

## 2022-11-17 MED ORDER — OXYCODONE HCL 5 MG PO TABS
5.0000 mg | ORAL_TABLET | ORAL | Status: AC
  Administered 2022-11-17: 5 mg via ORAL
  Filled 2022-11-17: qty 1

## 2022-11-17 MED ORDER — OXYCODONE HCL 5 MG PO TABS: 5.0000 mg | ORAL_TABLET | ORAL | 0 refills | Status: DC | PRN

## 2022-11-17 MED ORDER — AMLODIPINE BESYLATE 10 MG PO TABS
10.0000 mg | ORAL_TABLET | Freq: Every day | ORAL | 3 refills | Status: DC
Start: 2022-11-17 — End: 2024-01-02

## 2022-11-17 NOTE — ED Triage Notes (Signed)
Report injured right knee by hitting a bar as he was coming up the truck.

## 2022-11-17 NOTE — ED Provider Notes (Signed)
Jonathan Stone Provider Note   CSN: [id number] Arrival date & time: 11/17/22  7829     History  Chief Complaint  Patient presents with   Knee Pain    Jonathan Stone is a 67 y.o. male.  66 year old male with history of diabetes and hypertension who presents to the emergency department with knee pain.  Patient reports that yesterday at 1030 he was getting up into a truck and hit his right knee On a metal bar.  Says that since then has had significant difficulty walking due to pain.  Has also noticed some swelling.  Not on blood thinners.  No other injuries as result of this.  No history of right lower extremity surgery.  Takes metformin and metoprolol.       Home Medications Prior to Admission medications   Medication Sig Start Date End Date Taking? Authorizing Provider  oxyCODONE (ROXICODONE) 5 MG immediate release tablet Take 1 tablet (5 mg total) by mouth every 4 (four) hours as needed for severe pain. 11/17/22  Yes Rondel Baton, MD  amLODipine (NORVASC) 10 MG tablet Take 1 tablet (10 mg total) by mouth daily. 11/17/22   Rondel Baton, MD  Azilsartan-Chlorthalidone (EDARBYCLOR) 40-12.5 MG TABS Take 1 tablet by mouth daily. 05/04/22   Ronnald Nian, MD  Blood Glucose Monitoring Suppl (ONE TOUCH ULTRA 2) w/Device KIT Use as directed 05/04/22   Ronnald Nian, MD  dapagliflozin propanediol (FARXIGA) 5 MG TABS tablet Take 1 tablet (5 mg total) by mouth daily before breakfast. 09/07/22   Ronnald Nian, MD  glucose blood [facility] ULTRA) test strip Use as instructed 05/04/22   Ronnald Nian, MD  Lancets [facility] ULTRASOFT) lancets Use as instructed 05/04/22   Ronnald Nian, MD  metFORMIN (GLUCOPHAGE) 500 MG tablet Take 1 tablet (500 mg total) by mouth 2 (two) times daily with a meal. 09/07/22   Ronnald Nian, MD  metoprolol succinate (TOPROL-XL) 100 MG 24 hr tablet Take 1 tablet (100 mg total) by mouth daily. Take with or  immediately following a meal. 09/07/22   Ronnald Nian, MD  metoprolol succinate (TOPROL-XL) 50 MG 24 hr tablet Take 1 tablet (50 mg total) by mouth daily. Take with or immediately following a meal. 09/07/22   Ronnald Nian, MD  rosuvastatin (CRESTOR) 20 MG tablet Take 1 tablet (20 mg total) by mouth daily. 05/04/22   Ronnald Nian, MD  tadalafil (CIALIS) 20 MG tablet Take 1 tablet (20 mg total) by mouth daily as needed for erectile dysfunction. 05/04/22   Ronnald Nian, MD      Allergies    Patient has no known allergies.    Review of Systems   Review of Systems  Physical Exam Updated Vital Signs BP (!) 192/84 (BP Location: Left Arm)   Pulse 60   Temp 98.4 F (36.9 C)   Resp 17   Ht 5\' 6"  (1.676 m)   Wt 81.6 kg   SpO2 99%   BMI 29.05 kg/m  Physical Exam Constitutional:      Appearance: Normal appearance.  HENT:     Head: Normocephalic and atraumatic.  Musculoskeletal:     Comments: Suprapatellar tenderness to palpation and swelling of the right knee.  Pain limits extension of right knee.  No patellar or tibial/proximal fibular tenderness to palpation.  Full passive range of motion.  DP pulse 2+.  Neurological:     Mental Status: He is alert.  ED Results / Procedures / Treatments   Labs (all labs ordered are listed, but only abnormal results are displayed) Labs Reviewed - No data to display  EKG None  Radiology DG Knee Complete 4 Views Right  Result Date: 11/17/2022 CLINICAL DATA:  pain. EXAM: RIGHT KNEE - COMPLETE 4+ VIEW COMPARISON:  None Available. FINDINGS: Soft tissue swelling is noted anteriorly. No acute fracture or dislocation. Tricompartmental osteophytosis. There is a small joint effusion. Faint foci of possible mineralization clustered within the soft tissues of the anterior thigh, just above patella. IMPRESSION: 1. No acute osseous abnormality. 2. Tricompartmental osteoarthritis with small joint effusion. 3. Faint foci of possible mineralization  clustered within the soft tissues of the anterior thigh, are nonspecific, although could relate to prior trauma. Electronically Signed   By: Hart Robinsons M.D.   On: 11/17/2022 10:47    Procedures Procedures    Medications Ordered in ED Medications  lidocaine (LIDODERM) 5 % 1 patch (1 patch Transdermal Patch Applied 11/17/22 1024)  oxyCODONE (Oxy IR/ROXICODONE) immediate release tablet 5 mg (5 mg Oral Given 11/17/22 1024)    ED Course/ Medical Decision Making/ A&P                                 Medical Decision Making Amount and/or Complexity of Data Reviewed Radiology: ordered.  Risk Prescription drug management.   Jonathan Stone is a 67 y.o. male with comorbidities that complicate the patient evaluation including diabetes and hypertension who presents to the emergency department with knee pain  Initial Ddx:  Knee fracture, quadriceps injury, patellar tendon injury, hypertensive emergency, asymptomatic hypertension  MDM/Course:  Patient presents to the emergency department knee pain after trauma.  Does have some suprapatellar tenderness to palpation and swelling.  No bony tenderness to palpation though.  X-ray did not show evidence of fracture or patella [place] to that would be concerning for quadriceps tendon rupture.  Upon re-evaluation patient was able to fully extend his right knee after pain medications so feel that quadriceps tendon injury is highly unlikely.  May potentially have some bruising as a result of the trauma.  Was given a knee sleeve and instructed to use crutches as needed for his pain.  Was also found to be hypertensive but not in hypertensive emergency.  Appears that he was previously taking amlodipine but says that he is only taking metoprolol now.  Was given a another prescription for his amlodipine and told to fill this and follow-up with his primary doctor and sports medicine.  This patient presents to the ED for concern of complaints listed in HPI, this  involves an extensive number of treatment options, and is a complaint that carries with it a high risk of complications and morbidity. Disposition including potential need for admission considered.   Dispo: DC Home. Return precautions discussed including, but not limited to, those listed in the AVS. Allowed pt time to ask questions which were answered fully prior to dc.  Additional history obtained from son Records reviewed Outpatient Clinic Notes I independently reviewed the following imaging with scope of interpretation limited to determining acute life threatening conditions related to emergency care: Extremity x-ray(s) and agree with the radiologist interpretation with the following exceptions: none I personally reviewed and interpreted the pt's EKG: see above for interpretation  I have reviewed the patients home medications and made adjustments as needed Social Determinants of health:  Elderly  Portions of this note were  generated with Scientist, clinical (histocompatibility and immunogenetics). Dictation errors may occur despite best attempts at proofreading.           Final Clinical Impression(s) / ED Diagnoses Final diagnoses:  Injury of right knee, initial encounter  Secondary hypertension    Rx / DC Orders ED Discharge Orders          Ordered    oxyCODONE (ROXICODONE) 5 MG immediate release tablet  Every 4 hours PRN        11/17/22 1058    amLODipine (NORVASC) 10 MG tablet  Daily        11/17/22 1058              Rondel Baton, MD 11/17/22 1154

## 2022-11-17 NOTE — Discharge Instructions (Addendum)
You were seen for your knee pain in the emergency department.   At home, please take tylenol for your pain.  Please ice your knee as well as swelling. You may also take the oxycodone we have prescribed you for any breakthrough pain that may have.  Do not take this before driving or operating heavy machinery.  Do not take this medication with alcohol.  Please start taking the amlodipine you have been prescribed for your blood pressure.   Check your MyChart online for the results of any tests that had not resulted by the time you left the emergency department.   Follow-up with sports medicine in 2-3 days regarding your visit and knee pain.   Return immediately to the emergency department if you experience any of the following: worsening pain, or any other concerning symptoms.    Thank you for visiting our Emergency Department. It was a pleasure taking care of you today.

## 2022-11-29 ENCOUNTER — Other Ambulatory Visit: Payer: Self-pay

## 2022-11-29 NOTE — Progress Notes (Signed)
11/29/2022 Name: Jonathan Stone MRN: [id number] DOB: 10/29/1955  Chief Complaint  Patient presents with   Hypertension   Diabetes    Jonathan Stone is a 67 y.o. year old male who presented for a telephone visit.   They were referred to the pharmacist by their PCP for assistance in managing hypertension.    Subjective:  Care Team: Primary Care Provider: Ronnald Nian, MD ; Next Scheduled Visit: 03/08/23  Medication Access/Adherence  Current Pharmacy:  [facility] [address] ([address]), [place] - [address] [place] ([place]) [place] [address] Phone: [phone] Fax: [phone]  [facility] - [place], [place] - [address] [place] [address] Phone: [phone] Fax: [phone]   Patient reports affordability concerns with their medications: No  Patient reports access/transportation concerns to their pharmacy: No  Patient reports adherence concerns with their medications:  Yes  -sometimes forgets to take his medications with him when he has to travel for work, discussed keeping at travel bag of meds for work at last call and patient states this has worked well- denies any missed doses in last 2 weeks   Hypertension:  Current medications: Amlodipine 10mg , Edarbyclor 40-12.5mg , Metoprolol XL 100mg  and 50mg  Medications previously tried: Lisinopril-HCTZ  Patient has a validated, automated, upper arm home BP cuff Current blood pressure readings readings: 10/4 138/67, 10/5 139/77, 10/7 138/67, 10/8 148/78, 10/9 150/85, 10/10 124/66, 10/11 136/75  Patient denies hypotensive s/sx including dizziness, lightheadedness.  Patient denies hypertensive symptoms including headache, chest pain, shortness of breath  Current meal patterns: travels a lot for work so does eat out, tries to be mindful of salt  Current physical activity: limited due to work   Diabetes:  Current medications: Farxiga 5mg , Metformin  500mg  1 BID Medications tried in the past: None  Current glucose readings: no long but reports reading of 72 this morning, fasting Using OneTouch meter; testing 1 time daily, always fasting  Denies any sugar readings outside of 70-130  Patient denies hypoglycemic s/sx including dizziness, shakiness, sweating. Patient denies hyperglycemic symptoms including polyuria, polydipsia, polyphagia, nocturia, neuropathy, blurred vision.   Current medication access support: None    Objective:  Lab Results  Component Value Date   HGBA1C 8.1 (A) 09/07/2022    Lab Results  Component Value Date   CREATININE 1.17 05/04/2022   BUN 14 05/04/2022   NA 140 05/04/2022   K 4.0 05/04/2022   CL 99 05/04/2022   CO2 25 05/04/2022    Lab Results  Component Value Date   CHOL 130 05/04/2022   HDL 39 (L) 05/04/2022   LDLCALC 65 05/04/2022   TRIG 148 05/04/2022   CHOLHDL 3.3 05/04/2022    Medications Reviewed Today     Reviewed by Sherrill Raring, RPH (Pharmacist) on 11/29/22 at 0909  Med List Status: <None>   Medication Order Taking? Sig Documenting Provider Last Dose Status Informant  amLODipine (NORVASC) 10 MG tablet [id number] Yes Take 1 tablet (10 mg total) by mouth daily. Rondel Baton, MD Taking Active   Azilsartan-Chlorthalidone (EDARBYCLOR) 40-12.5 MG TABS [id number] Yes Take 1 tablet by mouth daily. Ronnald Nian, MD Taking Active   Blood Glucose Monitoring Suppl (ONE TOUCH ULTRA 2) w/Device Andria Rhein [id number]  Use as directed Ronnald Nian, MD  Active   dapagliflozin propanediol (FARXIGA) 5 MG TABS tablet [id number] Yes Take 1 tablet (5 mg total) by mouth daily before breakfast. Sharlot Gowda  C, MD Taking Active   glucose blood (ONETOUCH ULTRA) test strip [id number]  Use as instructed Ronnald Nian, MD  Active   Lancets [facility] ULTRASOFT) lancets [id number]  Use as instructed Ronnald Nian, MD  Active   metFORMIN (GLUCOPHAGE) 500 MG tablet [id number] Yes Take 1 tablet (500 mg  total) by mouth 2 (two) times daily with a meal. Ronnald Nian, MD Taking Active   metoprolol succinate (TOPROL-XL) 100 MG 24 hr tablet [id number] Yes Take 1 tablet (100 mg total) by mouth daily. Take with or immediately following a meal. Ronnald Nian, MD Taking Active   metoprolol succinate (TOPROL-XL) 50 MG 24 hr tablet [id number] Yes Take 1 tablet (50 mg total) by mouth daily. Take with or immediately following a meal. Ronnald Nian, MD Taking Active   oxyCODONE (ROXICODONE) 5 MG immediate release tablet [id number] Yes Take 1 tablet (5 mg total) by mouth every 4 (four) hours as needed for severe pain. Rondel Baton, MD Taking Active   rosuvastatin (CRESTOR) 20 MG tablet [id number] Yes Take 1 tablet (20 mg total) by mouth daily. Ronnald Nian, MD Taking Active   tadalafil (CIALIS) 20 MG tablet [id number]  Take 1 tablet (20 mg total) by mouth daily as needed for erectile dysfunction. Ronnald Nian, MD  Active               Assessment/Plan:   Diabetes: - Currently uncontrolled at latest A1C check, denies any readings out of range - Reviewed long term cardiovascular and renal outcomes of uncontrolled blood sugar - Reviewed goal A1c, goal fasting, and goal 2 hour post prandial glucose - Reviewed dietary modifications including low carb diet, not drinking sugars - Reviewed lifestyle modifications including: physical activity of 150 min/week  - Recommend to check glucose at varying times of day, can stick to once daily but try to obtain some post-prandial readings as well -Continue Metformin dose for now, if sugar readings begin to elevate or A1C still not at goal at next check, consider metformin increase    Hypertension: - Currently controlled - Reviewed long term cardiovascular and renal outcomes of uncontrolled blood pressure - Reviewed appropriate blood pressure monitoring technique and reviewed goal blood pressure. Recommended to check home blood pressure and heart rate  daily - Recommend to continue current medication regimen     Follow Up Plan: 2 months  Sherrill Raring, PharmD Clinical Pharmacist [phone]

## 2023-02-03 ENCOUNTER — Other Ambulatory Visit: Payer: 59

## 2023-02-03 NOTE — Progress Notes (Deleted)
   02/03/2023  Patient ID: Jonathan Stone, male   DOB: 1955/07/09, 67 y.o.   MRN: [id number]  Attempted to contact patient for scheduled appointment for medication management. Left HIPAA compliant message for patient to return my call at their convenience.   Sherrill Raring, PharmD Clinical Pharmacist [phone]

## 2023-03-06 DIAGNOSIS — E1165 Type 2 diabetes mellitus with hyperglycemia: Secondary | ICD-10-CM | POA: Insufficient documentation

## 2023-03-08 ENCOUNTER — Ambulatory Visit: Payer: 59 | Admitting: Family Medicine

## 2023-03-08 VITALS — BP 138/70 | HR 64 | Ht 66.0 in | Wt 190.3 lb

## 2023-03-08 DIAGNOSIS — E1159 Type 2 diabetes mellitus with other circulatory complications: Secondary | ICD-10-CM | POA: Diagnosis not present

## 2023-03-08 DIAGNOSIS — E782 Mixed hyperlipidemia: Secondary | ICD-10-CM | POA: Diagnosis not present

## 2023-03-08 DIAGNOSIS — E1165 Type 2 diabetes mellitus with hyperglycemia: Secondary | ICD-10-CM | POA: Diagnosis not present

## 2023-03-08 DIAGNOSIS — I152 Hypertension secondary to endocrine disorders: Secondary | ICD-10-CM

## 2023-03-08 DIAGNOSIS — Z6830 Body mass index (BMI) 30.0-30.9, adult: Secondary | ICD-10-CM

## 2023-03-08 DIAGNOSIS — E66811 Obesity, class 1: Secondary | ICD-10-CM

## 2023-03-08 DIAGNOSIS — E6609 Other obesity due to excess calories: Secondary | ICD-10-CM

## 2023-03-08 DIAGNOSIS — Z23 Encounter for immunization: Secondary | ICD-10-CM | POA: Diagnosis not present

## 2023-03-08 LAB — POCT GLYCOSYLATED HEMOGLOBIN (HGB A1C): Hemoglobin A1C: 7.7 % — AB (ref 4.0–5.6)

## 2023-03-08 NOTE — Progress Notes (Signed)
   Subjective:    Patient ID: Jonathan Stone, male    DOB: December 26, 1955, 68 y.o.   MRN: [id number]  HPI He is here for follow-up for diabetes.  His work keeps him busy with physical activities.  He continues on metoprolol, amlodipine and Edarbychlor.  He is also taking metformin and [place].  Has no difficulty with any of these medications.  Smoking and drinking as was reviewed.   Review of Systems     Objective:    Physical Exam Alert and in no distress otherwise not examined.  Hemoglobin A1c is 7.7       Assessment & Plan:  Class 1 obesity due to excess calories with serious comorbidity and body mass index (BMI) of 30.0 to 30.9 in adult  Hypertension associated with diabetes (HCC)  Mixed hyperlipidemia  Type 2 diabetes mellitus with hyperglycemia, without long-term current use of insulin (HCC) - Plan: POCT glycosylated hemoglobin (Hb A1C), POCT UA - Microalbumin  Need for COVID-19 vaccine - Plan: Pfizer Comirnaty Covid -19 Vaccine 29yrs and older  Need for influenza vaccination - Plan: Flu Vaccine Trivalent High Dose (Fluad)  I again discussed diet and exercise with him specifically cutting back on carbohydrates.  Discussed the fact that his hemoglobin A1c is okay but I would prefer that it be little bit lower.  Encouraged dietary modification and possibly increasing his physical activities if possible.

## 2023-07-05 ENCOUNTER — Ambulatory Visit: Payer: 59 | Admitting: Family Medicine

## 2023-07-05 VITALS — BP 128/70 | HR 60 | Wt 187.8 lb

## 2023-07-05 DIAGNOSIS — E1165 Type 2 diabetes mellitus with hyperglycemia: Secondary | ICD-10-CM

## 2023-07-05 DIAGNOSIS — I152 Hypertension secondary to endocrine disorders: Secondary | ICD-10-CM

## 2023-07-05 DIAGNOSIS — E1159 Type 2 diabetes mellitus with other circulatory complications: Secondary | ICD-10-CM | POA: Diagnosis not present

## 2023-07-05 DIAGNOSIS — E782 Mixed hyperlipidemia: Secondary | ICD-10-CM | POA: Diagnosis not present

## 2023-07-05 DIAGNOSIS — E66811 Obesity, class 1: Secondary | ICD-10-CM

## 2023-07-05 DIAGNOSIS — Z6830 Body mass index (BMI) 30.0-30.9, adult: Secondary | ICD-10-CM

## 2023-07-05 DIAGNOSIS — E6609 Other obesity due to excess calories: Secondary | ICD-10-CM

## 2023-07-05 LAB — POCT GLYCOSYLATED HEMOGLOBIN (HGB A1C): Hemoglobin A1C: 7.9 % — AB (ref 4.0–5.6)

## 2023-07-05 MED ORDER — METFORMIN HCL 850 MG PO TABS: 850.0000 mg | ORAL_TABLET | Freq: Two times a day (BID) | ORAL | 1 refills | Status: AC

## 2023-07-05 MED ORDER — EMPAGLIFLOZIN 10 MG PO TABS
10.0000 mg | ORAL_TABLET | Freq: Every day | ORAL | 1 refills | Status: DC
Start: 2023-07-05 — End: 2023-11-08

## 2023-07-05 NOTE — Progress Notes (Signed)
  Subjective:    Patient ID: Jonathan Stone, male    DOB: [birth year], 68 y.o.   MRN: [id number]  Jonathan Stone is a 68 y.o. male who presents for follow-up of Type 2 diabetes mellitus.  Home blood sugar records: 98 is his normal range.  Current symptoms/problems include none and have been stable. Daily foot checks: yes   Any foot concerns: no  How often blood sugars checked: every other day  Exercise: does a lot of walking  Diet: no general diet he was given Farxiga  in the past but for what ever reason he is not taking it at the present time.  He does continue on Glucophage .  He also continues on his blood pressure medications and Crestor . The following portions of the patient's history were reviewed and updated as appropriate: allergies, current medications, past medical history, past social history and problem list.  ROS as in subjective above.     Objective:     Physical Exam Alert and in no distress otherwise not examined. Hemoglobin A1c is 7.9     Latest Ref Rng & Units 07/05/2023    9:34 AM 03/08/2023    9:04 AM 09/07/2022    8:39 AM 05/04/2022    9:29 AM 05/04/2022    8:44 AM  Diabetic Labs  HbA1c 4.0 - 5.6 % 7.9  7.7  8.1   10.0   Chol 100 - 199 mg/dL    409    HDL >81 mg/dL    39    Calc LDL 0 - 99 mg/dL    65    Triglycerides 0 - 149 mg/dL    191    Creatinine 4.78 - 1.27 mg/dL    2.95        08/05/3084    9:19 AM 03/08/2023    8:36 AM 11/29/2022    9:08 AM 11/17/2022    9:35 AM 09/07/2022    8:15 AM  BP/Weight  Systolic BP 128 138 136 192 190  Diastolic BP 70 70 75 84 92  Wt. (Lbs) 187.8 190.3  180   BMI 30.31 kg/m2 30.72 kg/m2  29.05 kg/m2       11/15/2022    2:32 PM  Foot/eye exam completion dates  Eye Exam --         This result is from an external source.    Rocky  reports that he has never smoked. He has never used smokeless tobacco. He reports that he does not drink alcohol and does not use drugs.     Assessment & Plan:    Type 2 diabetes  mellitus with hyperglycemia, without long-term current use of insulin (HCC) - Plan: POCT glycosylated hemoglobin (Hb A1C), empagliflozin (JARDIANCE) 10 MG TABS tablet  Mixed hyperlipidemia  Hypertension associated with diabetes (HCC)  Class 1 obesity due to excess calories with serious comorbidity and body mass index (BMI) of 30.0 to 30.9 in adult I will increase his metformin  to 850 twice daily.  I also switch him to [place].  Discussed checking his blood sugars regularly either before meal or 2 hours after meal.  Encouraged continued physical activity.  Recheck here in 4 months.

## 2023-07-05 NOTE — Patient Instructions (Signed)
 Check your blood sugar either before a meal or 2 hours after meal

## 2023-10-26 ENCOUNTER — Other Ambulatory Visit: Payer: Self-pay | Admitting: Family Medicine

## 2023-10-26 DIAGNOSIS — E1159 Type 2 diabetes mellitus with other circulatory complications: Secondary | ICD-10-CM

## 2023-11-08 ENCOUNTER — Encounter: Payer: Self-pay | Admitting: Family Medicine

## 2023-11-08 ENCOUNTER — Ambulatory Visit (INDEPENDENT_AMBULATORY_CARE_PROVIDER_SITE_OTHER): Admitting: Family Medicine

## 2023-11-08 VITALS — BP 138/74 | HR 68 | Ht 66.5 in | Wt 183.4 lb

## 2023-11-08 DIAGNOSIS — E785 Hyperlipidemia, unspecified: Secondary | ICD-10-CM

## 2023-11-08 DIAGNOSIS — Z23 Encounter for immunization: Secondary | ICD-10-CM

## 2023-11-08 DIAGNOSIS — E1169 Type 2 diabetes mellitus with other specified complication: Secondary | ICD-10-CM | POA: Diagnosis not present

## 2023-11-08 DIAGNOSIS — E1165 Type 2 diabetes mellitus with hyperglycemia: Secondary | ICD-10-CM | POA: Diagnosis not present

## 2023-11-08 DIAGNOSIS — E1159 Type 2 diabetes mellitus with other circulatory complications: Secondary | ICD-10-CM

## 2023-11-08 DIAGNOSIS — I152 Hypertension secondary to endocrine disorders: Secondary | ICD-10-CM

## 2023-11-08 LAB — POCT GLYCOSYLATED HEMOGLOBIN (HGB A1C): Hemoglobin A1C: 7.1 % — AB (ref 4.0–5.6)

## 2023-11-08 MED ORDER — EMPAGLIFLOZIN 25 MG PO TABS
25.0000 mg | ORAL_TABLET | Freq: Every day | ORAL | 3 refills | Status: DC
Start: 2023-11-08 — End: 2024-01-02

## 2023-11-08 NOTE — Progress Notes (Signed)
   Subjective:    Patient ID: Jonathan Stone, male    DOB: Feb 15, 1956, 68 y.o.   MRN: [id number]  Discussed the use of AI scribe software for clinical note transcription with the patient, who gave verbal consent to proceed.  History of Present Illness   Jonathan Stone is a 68 year old male with diabetes who presents for follow-up on his diabetes management.  His last hemoglobin A1c in May was 7.9, and he was started on Jardiance . He is also taking metformin . His current A1c has improved to 7.1. No adverse effects from Jardiance , such as discharge, burning, stinging, or rash in the genital area.  He has been doing more walking, contributing to slight weight loss. No significant changes in eating habits.  No symptoms related to his diabetes medications.   He continues on amlodipine  and Edarbychor as well as Crestor .       Review of Systems     Objective:    Physical Exam Alert and in no distress otherwise not examined.  Hemoglobin A1c is 7.1             Assessment & Plan:  Assessment and Plan    Type 2 diabetes mellitus with hyperglycemia A1c improved from 7.9 to 7.1. Metformin  and Jardiance  well-tolerated. Increased physical activity aiding weight loss and glycemic control. Aim to reduce A1c below 7.0 to prevent microvascular complications. Jardiance  increase for additional protection. - Increase Jardiance  to 25 mg daily. Use current supply by taking two 10 mg tablets until depletion. - Call in prescription to [facility] with a 90-day supply and a refill. - Encourage continued physical activity and dietary modifications. - Schedule follow-up in 6 months to reassess A1c and diabetes management.  General Health Maintenance Routine immunizations and preventive care discussed. Shingles, tetanus, and pneumonia vaccinations up to date. Influenza, COVID-19, and RSV vaccinations recommended. - Administer influenza vaccine today. - Advise obtaining COVID-19 vaccine at the pharmacy. -  Recommend RSV vaccine to prevent respiratory syncytial virus infection.

## 2023-12-29 ENCOUNTER — Emergency Department (HOSPITAL_COMMUNITY)

## 2023-12-29 ENCOUNTER — Other Ambulatory Visit: Payer: Self-pay

## 2023-12-29 ENCOUNTER — Inpatient Hospital Stay (HOSPITAL_COMMUNITY)

## 2023-12-29 ENCOUNTER — Inpatient Hospital Stay (HOSPITAL_COMMUNITY)
Admission: EM | Admit: 2023-12-29 | Discharge: 2023-12-31 | DRG: 066 | Disposition: A | Discharge status: Home / Self Care | Attending: Emergency Medicine | Admitting: Emergency Medicine

## 2023-12-29 ENCOUNTER — Encounter (HOSPITAL_COMMUNITY): Payer: Self-pay

## 2023-12-29 DIAGNOSIS — I160 Hypertensive urgency: Secondary | ICD-10-CM | POA: Diagnosis present

## 2023-12-29 DIAGNOSIS — Z7984 Long term (current) use of oral hypoglycemic drugs: Secondary | ICD-10-CM

## 2023-12-29 DIAGNOSIS — R471 Dysarthria and anarthria: Secondary | ICD-10-CM | POA: Diagnosis present

## 2023-12-29 DIAGNOSIS — E782 Mixed hyperlipidemia: Secondary | ICD-10-CM | POA: Diagnosis not present

## 2023-12-29 DIAGNOSIS — E669 Obesity, unspecified: Secondary | ICD-10-CM | POA: Diagnosis not present

## 2023-12-29 DIAGNOSIS — Z833 Family history of diabetes mellitus: Secondary | ICD-10-CM | POA: Diagnosis not present

## 2023-12-29 DIAGNOSIS — Z79899 Other long term (current) drug therapy: Secondary | ICD-10-CM | POA: Diagnosis not present

## 2023-12-29 DIAGNOSIS — Z7982 Long term (current) use of aspirin: Secondary | ICD-10-CM

## 2023-12-29 DIAGNOSIS — I6389 Other cerebral infarction: Secondary | ICD-10-CM | POA: Diagnosis not present

## 2023-12-29 DIAGNOSIS — E7800 Pure hypercholesterolemia, unspecified: Secondary | ICD-10-CM | POA: Diagnosis present

## 2023-12-29 DIAGNOSIS — N179 Acute kidney failure, unspecified: Secondary | ICD-10-CM | POA: Diagnosis not present

## 2023-12-29 DIAGNOSIS — I6340 Cerebral infarction due to embolism of unspecified cerebral artery: Secondary | ICD-10-CM

## 2023-12-29 DIAGNOSIS — I10 Essential (primary) hypertension: Secondary | ICD-10-CM | POA: Diagnosis not present

## 2023-12-29 DIAGNOSIS — E1151 Type 2 diabetes mellitus with diabetic peripheral angiopathy without gangrene: Secondary | ICD-10-CM | POA: Diagnosis not present

## 2023-12-29 DIAGNOSIS — I119 Hypertensive heart disease without heart failure: Secondary | ICD-10-CM | POA: Diagnosis present

## 2023-12-29 DIAGNOSIS — T466X6A Underdosing of antihyperlipidemic and antiarteriosclerotic drugs, initial encounter: Secondary | ICD-10-CM | POA: Diagnosis not present

## 2023-12-29 DIAGNOSIS — I639 Cerebral infarction, unspecified: Secondary | ICD-10-CM | POA: Diagnosis present

## 2023-12-29 DIAGNOSIS — E1165 Type 2 diabetes mellitus with hyperglycemia: Secondary | ICD-10-CM | POA: Diagnosis present

## 2023-12-29 DIAGNOSIS — R29810 Facial weakness: Secondary | ICD-10-CM | POA: Diagnosis present

## 2023-12-29 DIAGNOSIS — Z91148 Patient's other noncompliance with medication regimen for other reason: Secondary | ICD-10-CM | POA: Diagnosis not present

## 2023-12-29 DIAGNOSIS — R29702 NIHSS score 2: Secondary | ICD-10-CM | POA: Diagnosis present

## 2023-12-29 DIAGNOSIS — I63411 Cerebral infarction due to embolism of right middle cerebral artery: Secondary | ICD-10-CM | POA: Diagnosis present

## 2023-12-29 DIAGNOSIS — I152 Hypertension secondary to endocrine disorders: Secondary | ICD-10-CM

## 2023-12-29 DIAGNOSIS — E785 Hyperlipidemia, unspecified: Secondary | ICD-10-CM | POA: Diagnosis not present

## 2023-12-29 DIAGNOSIS — I6381 Other cerebral infarction due to occlusion or stenosis of small artery: Secondary | ICD-10-CM | POA: Diagnosis not present

## 2023-12-29 LAB — DIFFERENTIAL
Abs Immature Granulocytes: 0.03 K/uL (ref 0.00–0.07)
Basophils Absolute: 0 K/uL (ref 0.0–0.1)
Basophils Relative: 0 %
Eosinophils Absolute: 0.2 K/uL (ref 0.0–0.5)
Eosinophils Relative: 2 %
Immature Granulocytes: 0 %
Lymphocytes Relative: 38 %
Lymphs Abs: 4.4 K/uL — ABNORMAL HIGH (ref 0.7–4.0)
Monocytes Absolute: 0.8 K/uL (ref 0.1–1.0)
Monocytes Relative: 7 %
Neutro Abs: 6.2 K/uL (ref 1.7–7.7)
Neutrophils Relative %: 53 %

## 2023-12-29 LAB — HIV ANTIBODY (ROUTINE TESTING W REFLEX): HIV Screen 4th Generation wRfx: NONREACTIVE

## 2023-12-29 LAB — I-STAT CHEM 8, ED
BUN: 16 mg/dL (ref 8–23)
Calcium, Ion: 1.25 mmol/L (ref 1.15–1.40)
Chloride: 102 mmol/L (ref 98–111)
Creatinine, Ser: 1.5 mg/dL — ABNORMAL HIGH (ref 0.61–1.24)
Glucose, Bld: 165 mg/dL — ABNORMAL HIGH (ref 70–99)
HCT: 52 % (ref 39.0–52.0)
Hemoglobin: 17.7 g/dL — ABNORMAL HIGH (ref 13.0–17.0)
Potassium: 4.8 mmol/L (ref 3.5–5.1)
Sodium: 140 mmol/L (ref 135–145)
TCO2: 31 mmol/L (ref 22–32)

## 2023-12-29 LAB — URINALYSIS, ROUTINE W REFLEX MICROSCOPIC
Bacteria, UA: NONE SEEN
Bilirubin Urine: NEGATIVE
Glucose, UA: NEGATIVE mg/dL
Ketones, ur: NEGATIVE mg/dL
Leukocytes,Ua: NEGATIVE
Nitrite: NEGATIVE
Protein, ur: 100 mg/dL — AB
Specific Gravity, Urine: 1.028 (ref 1.005–1.030)
pH: 5 (ref 5.0–8.0)

## 2023-12-29 LAB — COMPREHENSIVE METABOLIC PANEL WITH GFR
ALT: 19 U/L (ref 0–44)
AST: 26 U/L (ref 15–41)
Albumin: 4.1 g/dL (ref 3.5–5.0)
Alkaline Phosphatase: 83 U/L (ref 38–126)
Anion gap: 9 (ref 5–15)
BUN: 13 mg/dL (ref 8–23)
CO2: 31 mmol/L (ref 22–32)
Calcium: 9.8 mg/dL (ref 8.9–10.3)
Chloride: 100 mmol/L (ref 98–111)
Creatinine, Ser: 1.49 mg/dL — ABNORMAL HIGH (ref 0.61–1.24)
GFR, Estimated: 51 mL/min — ABNORMAL LOW (ref >60)
Glucose, Bld: 171 mg/dL — ABNORMAL HIGH (ref 70–99)
Potassium: 4.5 mmol/L (ref 3.5–5.1)
Sodium: 140 mmol/L (ref 135–145)
Total Bilirubin: 1.1 mg/dL (ref 0.0–1.2)
Total Protein: 7.4 g/dL (ref 6.5–8.1)

## 2023-12-29 LAB — CBC
HCT: 51.1 % (ref 39.0–52.0)
Hemoglobin: 16.9 g/dL (ref 13.0–17.0)
MCH: 27.4 pg (ref 26.0–34.0)
MCHC: 33.1 g/dL (ref 30.0–36.0)
MCV: 82.8 fL (ref 80.0–100.0)
Platelets: 188 K/uL (ref 150–400)
RBC: 6.17 MIL/uL — ABNORMAL HIGH (ref 4.22–5.81)
RDW: 13.3 % (ref 11.5–15.5)
WBC: 11.6 K/uL — ABNORMAL HIGH (ref 4.0–10.5)
nRBC: 0 % (ref 0.0–0.2)

## 2023-12-29 LAB — RAPID URINE DRUG SCREEN, HOSP PERFORMED
Amphetamines: NOT DETECTED
Barbiturates: NOT DETECTED
Benzodiazepines: NOT DETECTED
Cocaine: NOT DETECTED
Opiates: NOT DETECTED
Tetrahydrocannabinol: NOT DETECTED

## 2023-12-29 LAB — ETHANOL: Alcohol, Ethyl (B): <15 mg/dL (ref <15)

## 2023-12-29 LAB — CBG MONITORING, ED
Glucose-Capillary: 168 mg/dL — ABNORMAL HIGH (ref 70–99)
Glucose-Capillary: 130 mg/dL — ABNORMAL HIGH (ref 70–99)

## 2023-12-29 LAB — PROTIME-INR
INR: 1 (ref 0.8–1.2)
Prothrombin Time: 13.9 s (ref 11.4–15.2)

## 2023-12-29 LAB — APTT: aPTT: 29 s (ref 24–36)

## 2023-12-29 LAB — TROPONIN I (HIGH SENSITIVITY): Troponin I (High Sensitivity): 24 ng/L — ABNORMAL HIGH (ref <18)

## 2023-12-29 MED ORDER — CLOPIDOGREL BISULFATE 75 MG PO TABS
75.0000 mg | ORAL_TABLET | Freq: Every day | ORAL | Status: DC
  Administered 2023-12-29 – 2023-12-31 (×3): 75 mg via ORAL
  Filled 2023-12-29 (×3): qty 1

## 2023-12-29 MED ORDER — EMPAGLIFLOZIN 25 MG PO TABS
25.0000 mg | ORAL_TABLET | Freq: Every day | ORAL | Status: DC
  Administered 2023-12-30 – 2023-12-31 (×2): 25 mg via ORAL
  Filled 2023-12-29 (×2): qty 1

## 2023-12-29 MED ORDER — SENNOSIDES-DOCUSATE SODIUM 8.6-50 MG PO TABS
1.0000 | ORAL_TABLET | Freq: Every evening | ORAL | Status: DC | PRN
Start: 2023-12-29 — End: 2023-12-31

## 2023-12-29 MED ORDER — ACETAMINOPHEN 325 MG PO TABS: 650.0000 mg | ORAL_TABLET | ORAL | Status: DC | PRN

## 2023-12-29 MED ORDER — ASPIRIN 81 MG PO TBEC
81.0000 mg | DELAYED_RELEASE_TABLET | Freq: Every day | ORAL | Status: DC
  Administered 2023-12-30 – 2023-12-31 (×2): 81 mg via ORAL
  Filled 2023-12-29 (×3): qty 1

## 2023-12-29 MED ORDER — ACETAMINOPHEN 160 MG/5ML PO SOLN: 650.0000 mg | ORAL | Status: DC | PRN

## 2023-12-29 MED ORDER — ENOXAPARIN SODIUM 40 MG/0.4ML IJ SOSY
40.0000 mg | PREFILLED_SYRINGE | INTRAMUSCULAR | Status: DC
  Administered 2023-12-29 – 2023-12-30 (×2): 40 mg via SUBCUTANEOUS
  Filled 2023-12-29 (×2): qty 0.4

## 2023-12-29 MED ORDER — ROSUVASTATIN CALCIUM 20 MG PO TABS
20.0000 mg | ORAL_TABLET | Freq: Every day | ORAL | Status: DC
  Administered 2023-12-30 – 2023-12-31 (×2): 20 mg via ORAL
  Filled 2023-12-29 (×2): qty 1

## 2023-12-29 MED ORDER — IOHEXOL 350 MG/ML SOLN
75.0000 mL | Freq: Once | INTRAVENOUS | Status: AC | PRN
  Administered 2023-12-29: 75 mL via INTRAVENOUS

## 2023-12-29 MED ORDER — INSULIN ASPART 100 UNIT/ML IJ SOLN
0.0000 [IU] | Freq: Three times a day (TID) | INTRAMUSCULAR | Status: DC
  Administered 2023-12-31: 2 [IU] via SUBCUTANEOUS

## 2023-12-29 MED ORDER — SODIUM CHLORIDE 0.9 % IV BOLUS
1000.0000 mL | Freq: Once | INTRAVENOUS | Status: AC
  Administered 2023-12-29: 1000 mL via INTRAVENOUS

## 2023-12-29 MED ORDER — ACETAMINOPHEN 650 MG RE SUPP: 650.0000 mg | RECTAL | Status: DC | PRN

## 2023-12-29 MED ORDER — METFORMIN HCL 850 MG PO TABS: 850.0000 mg | ORAL_TABLET | Freq: Two times a day (BID) | ORAL | Status: DC

## 2023-12-29 MED ORDER — INSULIN ASPART 100 UNIT/ML IJ SOLN: 0.0000 [IU] | Freq: Every day | INTRAMUSCULAR | Status: DC

## 2023-12-29 MED ORDER — SODIUM CHLORIDE 0.9 % IV SOLN: INTRAVENOUS | Status: DC

## 2023-12-29 MED ORDER — STROKE: EARLY STAGES OF RECOVERY BOOK
Freq: Once | Status: AC
  Filled 2023-12-29: qty 1

## 2023-12-29 NOTE — H&P (Signed)
 History and Physical  Jonathan Stone FMW:[id number] DOB: 1955/07/22 DOA: 12/29/2023  PCP: Joyce Norleen BROCKS, MD   Chief Complaint: Slurred speech, left facial droop, left arm weakness and numbness  HPI: Jonathan Stone is a 68 y.o. male with medical history significant for HTN, HLD, obesity and T2DM who presented to the ED for evaluation of left-sided facial droop, left arm weakness and numbness. Patient reports he was in his usual state of health when he went to sleep last night. He woke up around 6:30 AM this morning with left facial droop and slurred speech. He also reports associated left facial numbness, left arm numbness, tingling and weakness. After getting up, he stumbled while walking to the bathroom. He called his son who advised him to present to the ER for further evaluation. He continues to have slurred speech but the numbness has resolved he denies any headaches, vision changes, leg weakness, nausea, vomiting, chest pain, shortness of breath or palpitations. 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 He denies any tobacco use. He denies any personal or family history of strokes, reports his 24 year old father is still alive.  ED Course: Initial vitals show patient afebrile, hypertensive with SBP in the 190s to 220s. Initial labs significant for creatinine 1.49, glucose 171, WBC 11.6, troponin 24, normal UDS and ethanol levels, UA with small hemoglobinuria and proteinuria but no signs of infection. CT head shows focal hypodensity within the right basal ganglia and extending into the periventricular white matter suspicious for acute infarct but no hemorrhage or mass effect. Neurology was consulted for evaluation. TRH was consulted for admission.   Review of Systems: Please see HPI for pertinent positives and negatives. A complete 10 system review of systems are otherwise negative.  Past Medical History:  Diagnosis Date    Dyslipidemia    Hyperlipidemia LDL goal < 100 01/29/2011   Hyperlipidemia with target LDL less than 100 01/29/2011   Hypertension    Obesity    Past Surgical History:  Procedure Laterality Date   COLONOSCOPY  2010   Dr.perry   POLYPECTOMY     Social History:  reports that he has never smoked. He has never used smokeless tobacco. He reports that he does not drink alcohol and does not use drugs.  No Known Allergies  Family History  Problem Relation Age of Onset   Diabetes Mother    Colon cancer Neg Hx    Colon polyps Neg Hx    Esophageal cancer Neg Hx    Rectal cancer Neg Hx    Stomach cancer Neg Hx      Prior to Admission medications   Medication Sig Start Date End Date Taking? Authorizing Provider  amLODipine  (NORVASC ) 10 MG tablet Take 1 tablet (10 mg total) by mouth daily. 11/17/22   Yolande Lamar BROCKS, MD  Azilsartan-Chlorthalidone  (EDARBYCLOR ) 40-12.5 MG TABS Take 1 tablet by mouth daily. 05/04/22   Lalonde, John C, MD  Blood Glucose Monitoring Suppl (ONE TOUCH ULTRA 2) w/Device KIT Use as directed 05/04/22   Joyce Norleen BROCKS, MD  empagliflozin  (JARDIANCE ) 25 MG TABS tablet Take 1 tablet (25 mg total) by mouth daily. 11/08/23   Joyce Norleen BROCKS, MD  glucose blood [facility] ULTRA) test strip Use as instructed 05/04/22   Joyce Norleen BROCKS, MD  Lancets [facility] ULTRASOFT) lancets Use as instructed 05/04/22   Joyce Norleen BROCKS, MD  metFORMIN  (GLUCOPHAGE ) 850 MG tablet Take 1 tablet (850 mg total) by mouth  2 (two) times daily with a meal. 07/05/23   Joyce Norleen BROCKS, MD  metoprolol  succinate (TOPROL -XL) 100 MG 24 hr tablet TAKE 1 TABLET BY MOUTH ONCE DAILY TO  BE  TAKEN  WITH  OR  IMMEDIATELY  FOLLOWING  A  MEAL 10/26/23   Joyce Norleen BROCKS, MD  metoprolol  succinate (TOPROL -XL) 50 MG 24 hr tablet Take 1 tablet (50 mg total) by mouth daily. Take with or immediately following a meal. 09/07/22   Joyce Norleen BROCKS, MD  rosuvastatin  (CRESTOR ) 20 MG tablet Take 1 tablet (20 mg total) by mouth daily.  05/04/22   Joyce Norleen BROCKS, MD  tadalafil  (CIALIS ) 20 MG tablet Take 1 tablet (20 mg total) by mouth daily as needed for erectile dysfunction. 05/04/22   Joyce Norleen BROCKS, MD    Physical Exam: BP (!) 212/107   Pulse 65   Temp 98.3 F (36.8 C) (Oral)   Resp 17   Ht 5' 6 (1.676 m)   Wt 81.6 kg   SpO2 97%   BMI 29.05 kg/m  General: Pleasant, well-appearing elderly man laying in bed. No acute distress. HEENT: [place]/AT. Anicteric sclera.  PERRLA. EOMI. CV: RRR. No murmurs, rubs, or gallops. No LE edema Pulmonary: Lungs CTAB. Normal effort. No wheezing or rales. Abdominal: Soft, nontender, nondistended. Normal bowel sounds. Extremities: Palpable radial and DP pulses. Normal ROM. Skin: Warm and dry. No obvious rash or lesions. Neuro: A&Ox3. Left facial droop. Dysarthric speech. Tongue is midline. Comprehension intact. Strength 5/5 in all extremities. No drift. Normal finger-nose testing. Normal sensation to light touch.  Psych: Normal mood and affect          Labs on Admission:  Basic Metabolic Panel: Recent Labs  Lab 12/29/23 1409 12/29/23 1427  NA 140 140  K 4.5 4.8  CL 100 102  CO2 31  --   GLUCOSE 171* 165*  BUN 13 16  CREATININE 1.49* 1.50*  CALCIUM  9.8  --    Liver Function Tests: Recent Labs  Lab 12/29/23 1409  AST 26  ALT 19  ALKPHOS 83  BILITOT 1.1  PROT 7.4  ALBUMIN 4.1   No results for input(s): LIPASE, AMYLASE in the last 168 hours. No results for input(s): AMMONIA in the last 168 hours. CBC: Recent Labs  Lab 12/29/23 1409 12/29/23 1427  WBC 11.6*  --   NEUTROABS 6.2  --   HGB 16.9 17.7*  HCT 51.1 52.0  MCV 82.8  --   PLT 188  --    Cardiac Enzymes: No results for input(s): CKTOTAL, CKMB, CKMBINDEX, TROPONINI in the last 168 hours. BNP (last 3 results) No results for input(s): BNP in the last 8760 hours.  ProBNP (last 3 results) No results for input(s): PROBNP in the last 8760 hours.  CBG: Recent Labs  Lab 12/29/23 1456   GLUCAP 168*    Radiological Exams on Admission: MR BRAIN WO CONTRAST Result Date: 12/29/2023 CLINICAL DATA:  Initial evaluation for acute neuro deficit, stroke suspected. EXAM: MRI HEAD WITHOUT CONTRAST TECHNIQUE: Multiplanar, multiecho pulse sequences of the brain and surrounding structures were obtained without intravenous contrast. COMPARISON:  CT from earlier the same day. FINDINGS: Brain: Cerebral volume within normal limits. Mild patchy T2/FLAIR signal abnormality seen involving the periventricular and deep white matter, consistent with chronic small vessel ischemic disease, mild in nature. 2.6 cm focus of restricted diffusion seen involving the right basal ganglia, consistent with an acute to early subacute perforator type infarct (series 7, image 60). Associated mild petechial  blood products without hemorrhagic transformation or significant regional mass effect. No other evidence for acute or subacute ischemia. Gray-white matter differentiation otherwise maintained. No areas of chronic cortical infarction. No other acute or chronic intracranial blood products. No mass lesion, midline shift or mass effect no hydrocephalus or extra-axial fluid collection. Pituitary gland within normal limits. Vascular: Major intracranial vascular flow voids are maintained. Skull and upper cervical spine: Craniocervical junction within normal limits. Decreased T1 signal intensity within the bone marrow of the upper cervical spine, nonspecific, but most commonly related to anemia, smoking or obesity. No scalp soft tissue abnormality. Sinuses/Orbits: Globes and orbital soft tissues within normal limits. Mild scattered mucosal thickening present about the sphenoid ethmoidal sinuses. Paranasal sinuses are otherwise clear. No mastoid effusion. Other: None. IMPRESSION: 1. 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 Associated mild petechial blood products without hemorrhagic  transformation or significant regional mass effect. 2. Underlying mild chronic microvascular ischemic disease. Electronically Signed   By: Morene Hoard M.D.   On: 12/29/2023 21:22   CT HEAD WO CONTRAST Result Date: 12/29/2023 CLINICAL DATA:  Left-sided facial numbness and left arm weakness per epic notes EXAM: CT HEAD WITHOUT CONTRAST TECHNIQUE: Contiguous axial images were obtained from the base of the skull through the vertex without intravenous contrast. RADIATION DOSE REDUCTION: This exam was performed according to the departmental dose-optimization program which includes automated exposure control, adjustment of the mA and/or kV according to patient size and/or use of iterative reconstruction technique. COMPARISON:  None Available. FINDINGS: Brain: No hemorrhage or intracranial mass. Focal hypodensity within the right basal ganglia and extending into the periventricular white matter. No midline shift or significant mass effect. Mild atrophy. Patchy white matter hypodensities suggestive of mild chronic small vessel disease. Nonenlarged ventricles Vascular: No hyperdense vessels.  No unexpected calcification Skull: Normal. Negative for fracture or focal lesion. Sinuses/Orbits: No acute finding. Other: None IMPRESSION: 1. Focal hypodensity within the right basal ganglia and extending into the periventricular white matter, suspicious for acute infarct. No hemorrhage or mass effect. 2. Mild atrophy and chronic small vessel disease. Electronically Signed   By: Luke Bun M.D.   On: 12/29/2023 15:23   My independent interpretation of EKG: shows sinus rhythm with LVH, biatrial enlargement, nonspecific ST and T wave changes. When compared to EKG in 2010, relatively unchanged but right atrial enlargement is new.  Assessment/Plan Jonathan Stone is a 68 y.o. male with medical history significant for  HTN, HLD, obesity and T2DM who presented to the ED for evaluation of left-sided facial droop, left arm  weakness and numbness and admitted for acute cerebrovascular accident.  # Acute CVA - Patient presented with acute onset of slurred speech, left facial droop and numbness, left arm weakness and numbness - CTH shows focal right basal ganglia hypodensity concerning for acute infarct. MRI brain confirms a 2.7 cm acute or early subacute perforator type infarct involving the right basal ganglia with mild petechial blood products without hemorrhagic transformation or significant regional mass effect - On exam, neurodeficits includes slurred speech, left facial droop and slight left arm weakness - Neurology consulted, plan discussed with Dr. Vanessa who suspect an embolic source - Initating DAPT with aspirin and Plavix for 3 weeks then aspirin alone - Patient and family educated and counseled on the importance of controlling patient's stroke risk factors - Follow-up CTA head and neck and echocardiogram - Follow-up lipid panel and A1c - Permissive hypertension w/ BP goal < 220/120 -  Frequent neurochecks - PT/OT/SLP eval - Telemetry  # Hypertensive urgency - BP significantly elevated with SBP in the 190s to 220s - Hold home BP meds for 24 hours, resume tomorrow - Long-term BP goal <130/80  # T2DM - Last A1c 7.1% last month - Blood sugar 168 on admission - Home regimen includes metformin  and Jardiance  - Continue metformin  and Jardiance  - SSI with meals, CBG monitoring - Follow-up repeat A1c  # AKI - Slight bump in creatinine to 1.49 from normal baseline - Likely secondary to mild dehydration - Start IV NS 1 L bolus followed by 100 cc/h for 10 hours - Avoid nephrotoxic agents and trend renal function  # HLD - Continue rosuvastatin  - Follow-up repeat lipid panel   DVT prophylaxis: Lovenox     Code Status: Full Code  Consults called: Neurology  Family Communication: Findings discussed with patient's son and spouse at bedside  Severity of Illness: The appropriate patient status  for this patient is INPATIENT. Inpatient status is judged to be reasonable and necessary in order to provide the required intensity of service to ensure the patient's safety. The patient's presenting symptoms, physical exam findings, and initial radiographic and laboratory data in the context of their chronic comorbidities is felt to place them at high risk for further clinical deterioration. Furthermore, it is not anticipated that the patient will be medically stable for discharge from the hospital within 2 midnights of admission.   * I certify that at the point of admission it is my clinical judgment that the patient will require inpatient hospital care spanning beyond 2 midnights from the point of admission due to high intensity of service, high risk for further deterioration and high frequency of surveillance required.*  Level of care: Progressive   I personally spent a total of 75 minutes in the care of the patient today including preparing to see the patient, getting/reviewing separately obtained history, performing a medically appropriate exam/evaluation, counseling and educating, placing orders, referring and communicating with other health care professionals, documenting clinical information in the EHR, independently interpreting results, and communicating results.   Lou Claretta HERO, MD 12/29/2023, 9:40 PM [facility] Pager: [phone] Isaiah 41:10   If 7PM-7AM, please contact night-coverage [email or web address] Password [email or web address]

## 2023-12-29 NOTE — ED Triage Notes (Addendum)
 Patient woke up this morning at 6:30am with left sided facial droop and heaviness, left side arm heaviness and hand tingling. Slurred speech. Denies visual changes. A&Ox4 and ambulatory on arrival.  Reports he went ot bed at 10:30pm lasyt night and felt normal.

## 2023-12-29 NOTE — ED Provider Triage Note (Signed)
 Emergency Medicine Provider Triage Evaluation Note  Jonathan Stone , a 68 y.o. male  was evaluated in triage.  Pt complains of left facial numbness left arm weakness and speech difficulty.  Last known well 10:30 PM.  He woke with the symptoms this morning.  He has a history of hypertension hyperlipidemia and diabetes.  No weakness in the left lower extremity no neglect, no drift, no visual abnormalities or field cuts.  No history of tobacco abuse..  Review of Systems  Positive: Neurodeficit Negative: Headache  Physical Exam  Ht 5' 6 (1.676 m)   Wt 81.6 kg   BMI 29.05 kg/m  Gen:   Awake, no distress   Resp:  Normal effort  MSK:   Left arm weakness Other:    Medical Decision Making  Medically screening exam initiated at 2:11 PM.  Appropriate orders placed.  Jonathan Stone was informed that the remainder of the evaluation will be completed by another provider, this initial triage assessment does not replace that evaluation, and the importance of remaining in the ED until their evaluation is complete.  Patient out of the window for code stroke, stroke workup initiated, does not meet LVO criteria.   Arloa Chroman, PA-C 12/29/23 1428

## 2023-12-29 NOTE — ED Triage Notes (Signed)
 Pt came in via POV d/t waking up this morning with slurred speech, Lt side of face feeling numb & a slight Lt side facial droop. LKN when he went to bed at 10:30pm last night. A/Ox4, denies any acute pain, no unilateral weakness/droop noted in triage, does still have s/s he is presenting here for.

## 2023-12-29 NOTE — Consult Note (Signed)
 NEUROLOGY CONSULT NOTE   Date of service: December 29, 2023 Patient Name: Jonathan Stone MRN:  [id number] DOB:  1955/06/12 Chief Complaint: L facial droop and L facial numbness Requesting Provider: Geraldene Hamilton, MD  History of Present Illness  Jonathan Stone is a 68 y.o. male with hx of HTN, HLD, obesity, who presents with left facial droop, left arm numbness and slurred speech.  He went to bed last night around 1030 and woke up with the symptoms in the morning.  He initially did not want to come to the hospital but upon family's insistence agreed to come into the ED for further evaluation as his symptoms were persistent.  He denies any prior history of strokes, no family history of strokes, he does not smoke, does not use any recreational substances.  He drinks alcohol socially.  LKW: 2230 on 12/28/2023 Modified rankin score: 0-Completely asymptomatic and back to baseline post- stroke IV Thrombolysis: Not offered, too mild to treat and outside of window.   EVT: Not offered, too mild to treat and low suspicion for LVO.    NIHSS components Score: Comment  1a Level of Conscious 0[]  1[]  2[]  3[]      1b LOC Questions 0[]  1[]  2[]       1c LOC Commands 0[]  1[]  2[]       2 Best Gaze 0[]  1[]  2[]       3 Visual 0[]  1[]  2[]  3[]      4 Facial Palsy 0[]  1[x]  2[]  3[]      5a Motor Arm - left 0[]  1[]  2[]  3[]  4[]  UN[]    5b Motor Arm - Right 0[]  1[]  2[]  3[]  4[]  UN[]    6a Motor Leg - Left 0[]  1[]  2[]  3[]  4[]  UN[]    6b Motor Leg - Right 0[]  1[]  2[]  3[]  4[]  UN[]    7 Limb Ataxia 0[]  1[]  2[]  UN[]      8 Sensory 0[]  1[]  2[]  UN[]      9 Best Language 0[]  1[]  2[]  3[]      10 Dysarthria 0[]  1[x]  2[]  UN[]      11 Extinct. and Inattention 0[]  1[]  2[]       TOTAL: 2      ROS  Comprehensive ROS performed and pertinent positives documented in HPI   Past History   Past Medical History:  Diagnosis Date   Dyslipidemia    Hyperlipidemia LDL goal < 100 01/29/2011   Hyperlipidemia with target LDL less than 100  01/29/2011   Hypertension    Obesity     Past Surgical History:  Procedure Laterality Date   COLONOSCOPY  2010   Dr.perry   POLYPECTOMY      Family History: Family History  Problem Relation Age of Onset   Diabetes Mother    Colon cancer Neg Hx    Colon polyps Neg Hx    Esophageal cancer Neg Hx    Rectal cancer Neg Hx    Stomach cancer Neg Hx     Social History  reports that he has never smoked. He has never used smokeless tobacco. He reports that he does not drink alcohol and does not use drugs.  No Known Allergies  Medications   Current Facility-Administered Medications:    aspirin EC tablet 81 mg, 81 mg, Oral, Daily, Rosanne Wohlfarth, MD   clopidogrel (PLAVIX) tablet 75 mg, 75 mg, Oral, Daily, Tecia Cinnamon, MD  Current Outpatient Medications:    amLODipine  (NORVASC ) 10 MG tablet, Take 1 tablet (10 mg total) by mouth daily., Disp: 90 tablet, Rfl: 3   Azilsartan-Chlorthalidone  (  EDARBYCLOR ) 40-12.5 MG TABS, Take 1 tablet by mouth daily., Disp: 90 tablet, Rfl: 3   Blood Glucose Monitoring Suppl (ONE TOUCH ULTRA 2) w/Device KIT, Use as directed, Disp: 1 kit, Rfl: 0   empagliflozin  (JARDIANCE ) 25 MG TABS tablet, Take 1 tablet (25 mg total) by mouth daily., Disp: 90 tablet, Rfl: 3   glucose blood (ONETOUCH ULTRA) test strip, Use as instructed, Disp: 100 each, Rfl: 12   Lancets (ONETOUCH ULTRASOFT) lancets, Use as instructed, Disp: 100 each, Rfl: 12   metFORMIN  (GLUCOPHAGE ) 850 MG tablet, Take 1 tablet (850 mg total) by mouth 2 (two) times daily with a meal., Disp: 180 tablet, Rfl: 1   metoprolol  succinate (TOPROL -XL) 100 MG 24 hr tablet, TAKE 1 TABLET BY MOUTH ONCE DAILY TO  BE  TAKEN  WITH  OR  IMMEDIATELY  FOLLOWING  A  MEAL, Disp: 90 tablet, Rfl: 0   metoprolol  succinate (TOPROL -XL) 50 MG 24 hr tablet, Take 1 tablet (50 mg total) by mouth daily. Take with or immediately following a meal., Disp: 90 tablet, Rfl: 3   rosuvastatin  (CRESTOR ) 20 MG tablet, Take 1 tablet  (20 mg total) by mouth daily., Disp: 90 tablet, Rfl: 3   tadalafil  (CIALIS ) 20 MG tablet, Take 1 tablet (20 mg total) by mouth daily as needed for erectile dysfunction., Disp: 30 tablet, Rfl: 3  Vitals   Vitals:   12/29/23 1402 12/29/23 1410 12/29/23 1415 12/29/23 2006  BP:  (!) 207/100 (!) 197/98 (!) 220/103  Pulse:  64 62 (!) 57  Resp:   15 18  Temp:   98.2 F (36.8 C) 98.3 F (36.8 C)  TempSrc:   Oral Oral  SpO2:   95% 98%  Weight: 81.6 kg     Height: 5' 6 (1.676 m)       Body mass index is 29.05 kg/m.   Physical Exam   General: Laying comfortably in bed; in no acute distress.  HENT: Normal oropharynx and mucosa. Normal external appearance of ears and nose.  Neck: Supple, no pain or tenderness  CV: No JVD. No peripheral edema.  Pulmonary: Symmetric Chest rise. Normal respiratory effort.  Abdomen: Soft to touch, non-tender.  Ext: No cyanosis, edema, or deformity  Skin: No rash. Normal palpation of skin.   Musculoskeletal: Normal digits and nails by inspection. No clubbing.   Neurologic Examination  Mental status/Cognition: Alert, oriented to self, place, month and year, good attention.  Speech/language: Dysarthric speech, fluent, comprehension intact, object naming intact, repetition intact.  Cranial nerves:   CN II Pupils equal and reactive to light, no VF deficits    CN III,IV,VI EOM intact, no gaze preference or deviation, no nystagmus    CN V normal sensation in V1, V2, and V3 segments bilaterally    CN VII LEFT FACIAL DROOP   CN VIII normal hearing to speech    CN IX & X normal palatal elevation, no uvular deviation    CN XI 5/5 head turn and 5/5 shoulder shrug bilaterally    CN XII midline tongue protrusion    Motor:  Muscle bulk: Normal, tone normal Mvmt Root Nerve  Muscle Right Left Comments  SA C5/6 Ax Deltoid 5 5   EF C5/6 Mc Biceps 5 5   EE C6/7/8 Rad Triceps 5 5   WF C6/7 Med FCR     WE C7/8 PIN ECU     F Ab C8/T1 U ADM/FDI 5 5   HF L1/2/3 Fem  Illopsoas 5 4+  KE L2/3/4 Fem Quad 5 5   DF L4/5 D Peron Tib Ant 5 5   PF S1/2 Tibial Grc/Sol 5 5    Sensation:  Light touch Intact throughout   Pin prick    Temperature    Vibration   Proprioception    Coordination/Complex Motor:  - Finger to Nose intact bilaterally - Heel to shin intact bilaterally - Rapid alternating movement are normal - Gait: Deferred. Labs/Imaging/Neurodiagnostic studies   CBC:  Recent Labs  Lab Jan 06, 2024 1409 01/06/24 1427  WBC 11.6*  --   NEUTROABS 6.2  --   HGB 16.9 17.7*  HCT 51.1 52.0  MCV 82.8  --   PLT 188  --    Basic Metabolic Panel:  Lab Results  Component Value Date   NA 140 01-06-2024   K 4.8 2024-01-06   CO2 31 01-06-2024   GLUCOSE 165 (H) 01-06-2024   BUN 16 January 06, 2024   CREATININE 1.50 (H) 2024-01-06   CALCIUM  9.8 2024/01/06   GFRNONAA 51 (L) January 06, 2024   GFRAA 68 12/11/2018   Lipid Panel:  Lab Results  Component Value Date   LDLCALC 65 05/04/2022   HgbA1c:  Lab Results  Component Value Date   HGBA1C 7.1 (A) 11/08/2023   Urine Drug Screen:     Component Value Date/Time   LABOPIA NONE DETECTED 01-06-24 1409   COCAINSCRNUR NONE DETECTED January 06, 2024 1409   LABBENZ NONE DETECTED 06-Jan-2024 1409   AMPHETMU NONE DETECTED Jan 06, 2024 1409   THCU NONE DETECTED 01-06-24 1409   LABBARB NONE DETECTED 01/06/2024 1409    Alcohol Level     Component Value Date/Time   ETH <15 01/06/2024 1409   INR  Lab Results  Component Value Date   INR 1.0 01/06/2024   APTT  Lab Results  Component Value Date   APTT 29 01-06-2024   AED levels: No results found for: PHENYTOIN, ZONISAMIDE, LAMOTRIGINE, LEVETIRACETA  CT Head without contrast(Personally reviewed): Small acute right basal ganglia infarct  CT angio Head and Neck with contrast(Personally reviewed): Pending  MRI Brain(Personally reviewed): 2.6 cm acute to early 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 Associated mild petechial  blood products without hemorrhagic transformation or significant regional mass effect.  ASSESSMENT   Dajohn Ellender is a 68 y.o. male with hx of HTN, HLD, obesity, who presents with left facial droop, left arm numbness and slurred speech.  He went to bed last night around 1030 and woke up with the symptoms in the morning.  MRI of the brain demonstrates 2.6 cm acute to early subacute right basal ganglia infarct.  Stroke is large and a would not expect from a small vessel stroke.  Suspect likely embolic.  RECOMMENDATIONS  - Frequent Neuro checks per stroke unit protocol - Recommend Vascular imaging with CT angio head and neck - Recommend obtaining TTE - Recommend obtaining Lipid panel with LDL - Please start statin if LDL > 70 - Recommend HbA1c to evaluate for diabetes and how well it is controlled. - Antithrombotic -aspirin 81 mg daily along with Plavix 75 mg daily for 21 days, followed by aspirin 81 mg daily alone. - Recommend DVT ppx - SBP goal - permissive hypertension first 24 h < 220/110. Held home meds.  - Recommend Telemetry monitoring for arrythmia - Recommend bedside swallow screen prior to PO intake. - Stroke education booklet - Recommend PT/OT/SLP consult  ______________________________________________________________________  Plan discussed with patient and significant other at bedside.  Plan also discussed with Dr. Lou with the  hospitalist team overnight.   I personally spent a total of 60 minutes in the care of the patient today including preparing to see the patient, getting/reviewing separately obtained history, performing a medically appropriate exam/evaluation, counseling and educating, placing orders, referring and communicating with other health care professionals, documenting clinical information in the EHR, independently interpreting results, communicating results, and coordinating care.   Signed, Lexis Potenza, MD [facility]

## 2023-12-29 NOTE — ED Notes (Signed)
 CCMD called to admit patient.

## 2023-12-29 NOTE — ED Provider Notes (Addendum)
 [place] EMERGENCY DEPARTMENT AT [facility] Provider Note   CSN: [id number] Arrival date & time: 12/29/23  1347     Patient presents with: Stroke s/s   Jonathan Stone is a 68 y.o. male presented to the emergency department for evaluation of new neurologic symptoms.  Patient's last known well was 10:30 PM yesterday.  He went to bed normally and when he woke this morning he felt like his face was numb on the left.  He noticed that his speech was abnormal and his left arm was weak.  His son brought him in for further evaluation.  He has a past medical history of obesity, diabetes, hyperlipidemia, hypertension.  He is not a smoker.  He has no other concerning symptoms.   HPI     Prior to Admission medications   Medication Sig Start Date End Date Taking? Authorizing Provider  amLODipine  (NORVASC ) 10 MG tablet Take 1 tablet (10 mg total) by mouth daily. 11/17/22   Yolande Lamar BROCKS, MD  Azilsartan-Chlorthalidone  (EDARBYCLOR ) 40-12.5 MG TABS Take 1 tablet by mouth daily. 05/04/22   Lalonde, John C, MD  Blood Glucose Monitoring Suppl (ONE TOUCH ULTRA 2) w/Device KIT Use as directed 05/04/22   Joyce Norleen BROCKS, MD  empagliflozin  (JARDIANCE ) 25 MG TABS tablet Take 1 tablet (25 mg total) by mouth daily. 11/08/23   Joyce Norleen BROCKS, MD  glucose blood [facility] ULTRA) test strip Use as instructed 05/04/22   Joyce Norleen BROCKS, MD  Lancets [facility] ULTRASOFT) lancets Use as instructed 05/04/22   Joyce Norleen BROCKS, MD  metFORMIN  (GLUCOPHAGE ) 850 MG tablet Take 1 tablet (850 mg total) by mouth 2 (two) times daily with a meal. 07/05/23   Joyce Norleen BROCKS, MD  metoprolol  succinate (TOPROL -XL) 100 MG 24 hr tablet TAKE 1 TABLET BY MOUTH ONCE DAILY TO  BE  TAKEN  WITH  OR  IMMEDIATELY  FOLLOWING  A  MEAL 10/26/23   Joyce Norleen BROCKS, MD  metoprolol  succinate (TOPROL -XL) 50 MG 24 hr tablet Take 1 tablet (50 mg total) by mouth daily. Take with or immediately following a meal. 09/07/22   Joyce Norleen BROCKS, MD   rosuvastatin  (CRESTOR ) 20 MG tablet Take 1 tablet (20 mg total) by mouth daily. 05/04/22   Joyce Norleen BROCKS, MD  tadalafil  (CIALIS ) 20 MG tablet Take 1 tablet (20 mg total) by mouth daily as needed for erectile dysfunction. 05/04/22   Lalonde, John C, MD    Allergies: Patient has no known allergies.    Review of Systems  Updated Vital Signs BP (!) 220/103 (BP Location: Right Arm)   Pulse (!) 57   Temp 98.3 F (36.8 C) (Oral)   Resp 18   Ht 5' 6 (1.676 m)   Wt 81.6 kg   SpO2 98%   BMI 29.05 kg/m   Physical Exam Vitals and nursing note reviewed.  Constitutional:      General: He is not in acute distress.    Appearance: He is well-developed. He is not diaphoretic.  HENT:     Head: Normocephalic and atraumatic.  Eyes:     General: No scleral icterus.    Extraocular Movements: Extraocular movements intact.     Conjunctiva/sclera: Conjunctivae normal.     Pupils: Pupils are equal, round, and reactive to light.     Comments: No visual field cut  Cardiovascular:     Rate and Rhythm: Normal rate and regular rhythm.     Heart sounds: Normal heart sounds.  Pulmonary:  Effort: Pulmonary effort is normal. No respiratory distress.     Breath sounds: Normal breath sounds.  Abdominal:     Palpations: Abdomen is soft.     Tenderness: There is no abdominal tenderness.  Musculoskeletal:     Cervical back: Normal range of motion and neck supple.  Skin:    General: Skin is warm and dry.  Neurological:     Mental Status: He is alert and oriented to person, place, and time.     Cranial Nerves: Cranial nerve deficit present.     Sensory: Sensory deficit present.     Motor: Weakness present.     Coordination: Coordination normal.     Gait: Gait normal.     Deep Tendon Reflexes: Reflexes normal.     Comments: Speech is dysarthric but goal oriented Left facial droop noted Abnormal sensation reported Normal finger-to-nose, no pronator drift, Neglect Weakness in the left upper  extremity 4 out of 5, 5 out of 5 strength in every other extremity.  He is ambulatory with normal gait and balance.   Psychiatric:        Behavior: Behavior normal.     (all labs ordered are listed, but only abnormal results are displayed) Labs Reviewed  CBC - Abnormal; Notable for the following components:      Result Value   WBC 11.6 (*)    RBC 6.17 (*)    All other components within normal limits  DIFFERENTIAL - Abnormal; Notable for the following components:   Lymphs Abs 4.4 (*)    All other components within normal limits  COMPREHENSIVE METABOLIC PANEL WITH GFR - Abnormal; Notable for the following components:   Glucose, Bld 171 (*)    Creatinine, Ser 1.49 (*)    GFR, Estimated 51 (*)    All other components within normal limits  URINALYSIS, ROUTINE W REFLEX MICROSCOPIC - Abnormal; Notable for the following components:   APPearance HAZY (*)    Hgb urine dipstick SMALL (*)    Protein, ur 100 (*)    All other components within normal limits  CBG MONITORING, ED - Abnormal; Notable for the following components:   Glucose-Capillary 168 (*)    All other components within normal limits  I-STAT CHEM 8, ED - Abnormal; Notable for the following components:   Creatinine, Ser 1.50 (*)    Glucose, Bld 165 (*)    Hemoglobin 17.7 (*)    All other components within normal limits  TROPONIN I (HIGH SENSITIVITY) - Abnormal; Notable for the following components:   Troponin I (High Sensitivity) 24 (*)    All other components within normal limits  PROTIME-INR  APTT  ETHANOL  RAPID URINE DRUG SCREEN, HOSP PERFORMED  HEMOGLOBIN A1C  LIPID PANEL    EKG: None  Radiology: CT HEAD WO CONTRAST Result Date: 12/29/2023 CLINICAL DATA:  Left-sided facial numbness and left arm weakness per epic notes EXAM: CT HEAD WITHOUT CONTRAST TECHNIQUE: Contiguous axial images were obtained from the base of the skull through the vertex without intravenous contrast. RADIATION DOSE REDUCTION: This exam was  performed according to the departmental dose-optimization program which includes automated exposure control, adjustment of the mA and/or kV according to patient size and/or use of iterative reconstruction technique. COMPARISON:  None Available. FINDINGS: Brain: No hemorrhage or intracranial mass. Focal hypodensity within the right basal ganglia and extending into the periventricular white matter. No midline shift or significant mass effect. Mild atrophy. Patchy white matter hypodensities suggestive of mild chronic small vessel disease. Nonenlarged  ventricles Vascular: No hyperdense vessels.  No unexpected calcification Skull: Normal. Negative for fracture or focal lesion. Sinuses/Orbits: No acute finding. Other: None IMPRESSION: 1. Focal hypodensity within the right basal ganglia and extending into the periventricular white matter, suspicious for acute infarct. No hemorrhage or mass effect. 2. Mild atrophy and chronic small vessel disease. Electronically Signed   By: Luke Bun M.D.   On: 12/29/2023 15:23     .Critical Care  Performed by: Arloa Chroman, PA-C Authorized by: Arloa Chroman, PA-C   Critical care provider statement:    Critical care time (minutes):  30   Critical care time was exclusive of:  Separately billable procedures and treating other patients   Critical care was necessary to treat or prevent imminent or life-threatening deterioration of the following conditions:  CNS failure or compromise   Critical care was time spent personally by me on the following activities:  Discussions with consultants, evaluation of patient's response to treatment, development of treatment plan with patient or surrogate, examination of patient, interpretation of cardiac output measurements, obtaining history from patient or surrogate, ordering and performing treatments and interventions, ordering and review of laboratory studies, ordering and review of radiographic studies, pulse oximetry and review of  old charts   Care discussed with: admitting provider      Medications Ordered in the ED  aspirin EC tablet 81 mg (has no administration in time range)  clopidogrel (PLAVIX) tablet 75 mg (has no administration in time range)                HEART Score: 3                    Medical Decision Making Amount and/or Complexity of Data Reviewed Labs: ordered. Radiology: ordered.  Risk Decision regarding hospitalization.   Patient here with acute neurologic deficit, differential diagnosis includes stroke, Bell's palsy, complex migraine. He has a past medical history of hypertension, hyperlipidemia and diabetes making him high risk for stroke.  I ordered labs including troponin which is mildly elevated, CBG of 168, 11 white count 11.6 in the setting of acute stroke and stress likely due to acute reaction, CMP shows slightly elevated serum creatinine.  Remainder of his labs are unremarkable.   Visualized and interpreted CT head without contrast which shows a focal hypodensity in the right basal ganglia extending into the periventricular white matter suspicious for acute infarct.  I discussed the case with Dr. Sal Khaliqdina who has placed stroke orders and will see the patient once he is in her room.      Final diagnoses:  Acute CVA (cerebrovascular accident) [facility])    ED Discharge Orders     None          Arloa Chroman, PA-C 12/29/23 2021    Arloa Chroman, PA-C 12/29/23 2030    Geraldene Hamilton, MD 01/01/24 1425

## 2023-12-30 ENCOUNTER — Inpatient Hospital Stay (HOSPITAL_COMMUNITY)

## 2023-12-30 DIAGNOSIS — Z91148 Patient's other noncompliance with medication regimen for other reason: Secondary | ICD-10-CM

## 2023-12-30 DIAGNOSIS — I6340 Cerebral infarction due to embolism of unspecified cerebral artery: Secondary | ICD-10-CM | POA: Diagnosis not present

## 2023-12-30 DIAGNOSIS — E785 Hyperlipidemia, unspecified: Secondary | ICD-10-CM | POA: Diagnosis not present

## 2023-12-30 DIAGNOSIS — Z7984 Long term (current) use of oral hypoglycemic drugs: Secondary | ICD-10-CM

## 2023-12-30 DIAGNOSIS — T466X6A Underdosing of antihyperlipidemic and antiarteriosclerotic drugs, initial encounter: Secondary | ICD-10-CM

## 2023-12-30 DIAGNOSIS — I6381 Other cerebral infarction due to occlusion or stenosis of small artery: Secondary | ICD-10-CM

## 2023-12-30 DIAGNOSIS — I6389 Other cerebral infarction: Secondary | ICD-10-CM

## 2023-12-30 DIAGNOSIS — I639 Cerebral infarction, unspecified: Secondary | ICD-10-CM | POA: Diagnosis not present

## 2023-12-30 DIAGNOSIS — I10 Essential (primary) hypertension: Secondary | ICD-10-CM | POA: Diagnosis not present

## 2023-12-30 DIAGNOSIS — Z7982 Long term (current) use of aspirin: Secondary | ICD-10-CM

## 2023-12-30 DIAGNOSIS — E1151 Type 2 diabetes mellitus with diabetic peripheral angiopathy without gangrene: Secondary | ICD-10-CM

## 2023-12-30 LAB — HEMOGLOBIN A1C
Hgb A1c MFr Bld: 7.2 % — ABNORMAL HIGH (ref 4.8–5.6)
Mean Plasma Glucose: 159.94 mg/dL

## 2023-12-30 LAB — CBC
HCT: 47.6 % (ref 39.0–52.0)
Hemoglobin: 15.4 g/dL (ref 13.0–17.0)
MCH: 27.2 pg (ref 26.0–34.0)
MCHC: 32.4 g/dL (ref 30.0–36.0)
MCV: 84.1 fL (ref 80.0–100.0)
Platelets: 144 K/uL — ABNORMAL LOW (ref 150–400)
RBC: 5.66 MIL/uL (ref 4.22–5.81)
RDW: 13.3 % (ref 11.5–15.5)
WBC: 9.9 K/uL (ref 4.0–10.5)
nRBC: 0 % (ref 0.0–0.2)

## 2023-12-30 LAB — GLUCOSE, CAPILLARY
Glucose-Capillary: 103 mg/dL — ABNORMAL HIGH (ref 70–99)
Glucose-Capillary: 152 mg/dL — ABNORMAL HIGH (ref 70–99)

## 2023-12-30 LAB — LIPID PANEL
Cholesterol: 141 mg/dL (ref 0–200)
HDL: 36 mg/dL — ABNORMAL LOW (ref >40)
LDL Cholesterol: 85 mg/dL (ref 0–99)
Total CHOL/HDL Ratio: 3.9 ratio
Triglycerides: 98 mg/dL (ref <150)
VLDL: 20 mg/dL (ref 0–40)

## 2023-12-30 LAB — BASIC METABOLIC PANEL WITH GFR
Anion gap: 9 (ref 5–15)
BUN: 11 mg/dL (ref 8–23)
CO2: 23 mmol/L (ref 22–32)
Calcium: 8.7 mg/dL — ABNORMAL LOW (ref 8.9–10.3)
Chloride: 106 mmol/L (ref 98–111)
Creatinine, Ser: 1.25 mg/dL — ABNORMAL HIGH (ref 0.61–1.24)
GFR, Estimated: >60 mL/min (ref >60)
Glucose, Bld: 103 mg/dL — ABNORMAL HIGH (ref 70–99)
Potassium: 3.8 mmol/L (ref 3.5–5.1)
Sodium: 138 mmol/L (ref 135–145)

## 2023-12-30 LAB — ECHOCARDIOGRAM COMPLETE
Area-P 1/2: 3.37 cm2
Height: 66 in
S' Lateral: 2.9 cm
Weight: 2880 [oz_av]

## 2023-12-30 LAB — CBG MONITORING, ED
Glucose-Capillary: 102 mg/dL — ABNORMAL HIGH (ref 70–99)
Glucose-Capillary: 108 mg/dL — ABNORMAL HIGH (ref 70–99)

## 2023-12-30 MED ORDER — PERFLUTREN LIPID MICROSPHERE
1.0000 mL | INTRAVENOUS | Status: AC | PRN
  Administered 2023-12-30: 4 mL via INTRAVENOUS

## 2023-12-30 MED ORDER — AMLODIPINE BESYLATE 5 MG PO TABS
10.0000 mg | ORAL_TABLET | Freq: Every day | ORAL | Status: DC
  Administered 2023-12-30 – 2023-12-31 (×2): 10 mg via ORAL
  Filled 2023-12-30 (×2): qty 2

## 2023-12-30 MED ORDER — METOPROLOL SUCCINATE ER 25 MG PO TB24
25.0000 mg | ORAL_TABLET | Freq: Every day | ORAL | Status: DC
  Administered 2023-12-31: 25 mg via ORAL
  Filled 2023-12-30: qty 1

## 2023-12-30 MED ORDER — AMLODIPINE BESYLATE 5 MG PO TABS: 5.0000 mg | ORAL_TABLET | Freq: Every day | ORAL | Status: DC

## 2023-12-30 MED ORDER — METOPROLOL SUCCINATE ER 25 MG PO TB24: 25.0000 mg | ORAL_TABLET | Freq: Every day | ORAL | Status: DC

## 2023-12-30 MED ORDER — STUDY - LIBREXIA-STROKE - MILVEXIAN 25 MG OR PLACEBO TABLET (PI-SETHI)
1.0000 | ORAL_TABLET | Freq: Two times a day (BID) | ORAL | Status: DC
  Administered 2023-12-30 – 2023-12-31 (×2): 1 via ORAL
  Filled 2023-12-30 (×2): qty 1

## 2023-12-30 NOTE — Evaluation (Signed)
 Occupational Therapy Evaluation Patient Details Name: Jonathan Stone MRN: [id number] DOB: November 19, 1955 Today's Date: 12/30/2023   History of Present Illness   Patient is a 68 yo male presenting to the ED with L sided facial droop, L sided tingling, slurred speech on 12/29/23. 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 CTA of head and neck clear. PMH includes: HTN, HLD, obesity     Clinical Impressions Prior to this admission, patient was living with his spouse, working as a naval architect, and fully independent. Currently, patient with minimal slurred speech, and set up for ADLs and supervision for mobility. Patient with no visual or cognitive deficits noted in OT evaluation. Patient would benefit from upper level cognitive test in order to ensure return to work and prior level independently. OT will follow acutely, but no OT required at discharge.   BP in supine: 179/91 (115) BP after brief bout of standing: 197/98 (128)      If plan is discharge home, recommend the following:   Direct supervision/assist for medications management;Direct supervision/assist for financial management;Assist for transportation;Assistance with cooking/housework (initally)     Functional Status Assessment   Patient has had a recent decline in their functional status and demonstrates the ability to make significant improvements in function in a reasonable and predictable amount of time.     Equipment Recommendations   None recommended by OT     Recommendations for Other Services         Precautions/Restrictions   Precautions Precautions: Fall Recall of Precautions/Restrictions: Intact Restrictions Weight Bearing Restrictions Per Provider Order: No     Mobility Bed Mobility Overal bed mobility: Needs Assistance Bed Mobility: Supine to Sit, Sit to Supine     Supine to sit: Supervision Sit to supine: Supervision         Transfers Overall transfer level: Needs assistance   Transfers: Sit to/from Stand Sit to Stand: Supervision           General transfer comment: supervision for safety, able to march in place without issue      Balance Overall balance assessment: Mild deficits observed, not formally tested                                         ADL either performed or assessed with clinical judgement   ADL Overall ADL's : Needs assistance/impaired Eating/Feeding: Set up;Sitting   Grooming: Set up;Sitting   Upper Body Bathing: Set up;Sitting   Lower Body Bathing: Set up;Sitting/lateral leans;Sit to/from stand   Upper Body Dressing : Set up;Sitting   Lower Body Dressing: Set up;Sitting/lateral leans;Sit to/from stand   Toilet Transfer: Set up;Ambulation;Regular Toilet   Toileting- Clothing Manipulation and Hygiene: Set up;Sitting/lateral lean;Sit to/from stand       Functional mobility during ADLs: Set up General ADL Comments: Prior to this admission, patient was living with his spouse, working as a naval architect, and fully independent. Currently, patient with minimal slurred speech, and set up for ADLs and supervision for mobility. Patient with no visual or cognitive deficits noted in OT evaluation. Patient would benefit from upper level cognitive test in order to ensure return to work and prior level independently. OT will follow acutely, but no OT required at discharge.     Vision Baseline Vision/History: 1 Wears glasses Ability to See in Adequate Light: 0 Adequate Patient Visual Report: No  change from baseline Vision Assessment?: Yes Eye Alignment: Within Functional Limits Ocular Range of Motion: Within Functional Limits Alignment/Gaze Preference: Within Defined Limits Tracking/Visual Pursuits: Able to track stimulus in all quads without difficulty Saccades: Within functional limits Convergence: Within functional limits Visual Fields: No apparent deficits      Perception Perception: Not tested       Praxis Praxis: Not tested       Pertinent Vitals/Pain Pain Assessment Pain Assessment: No/denies pain     Extremity/Trunk Assessment Upper Extremity Assessment Upper Extremity Assessment: Right hand dominant;Overall WFL for tasks assessed   Lower Extremity Assessment Lower Extremity Assessment: Defer to PT evaluation   Cervical / Trunk Assessment Cervical / Trunk Assessment: Normal   Communication Communication Communication: No apparent difficulties   Cognition Arousal: Alert Behavior During Therapy: WFL for tasks assessed/performed Cognition: No apparent impairments                               Following commands: Intact       Cueing  General Comments   Cueing Techniques: Verbal cues  Minimally elevated BP 197/98 (128) after sit<>stand RN notified   Exercises     Shoulder Instructions      Home Living Family/patient expects to be discharged to:: Private residence Living Arrangements: Spouse/significant other Available Help at Discharge: Available 24 hours/day;Family Type of Home: House Home Access: Stairs to enter [employer] of Steps: 5 Entrance Stairs-Rails: Left Home Layout: One level     Bathroom Shower/Tub: Producer, Television/film/video: Handicapped height     Home Equipment: None          Prior Functioning/Environment Prior Level of Function : Independent/Modified Independent;Working/employed;Driving             Mobility Comments: independent ADLs Comments: works as a naval architect, likes to do yard work    OT Problem List: Decreased activity tolerance   OT Treatment/Interventions: Therapist, Nutritional education;Therapeutic exercise;Energy conservation;Cognitive remediation/compensation;Balance training;Patient/family education;Therapeutic activities;Manual therapy;DME and/or AE instruction      OT Goals(Current goals can be found in the  care plan section)   Acute Rehab OT Goals Patient Stated Goal: to get back to my normal OT Goal Formulation: With patient/family Time For Goal Achievement: 01/13/24 Potential to Achieve Goals: Good   OT Frequency:  Min 2X/week    Co-evaluation              AM-PAC OT 6 Clicks Daily Activity     Outcome Measure Help from another person eating meals?: A Little Help from another person taking care of personal grooming?: A Little Help from another person toileting, which includes using toliet, bedpan, or urinal?: A Little Help from another person bathing (including washing, rinsing, drying)?: A Little Help from another person to put on and taking off regular upper body clothing?: A Little Help from another person to put on and taking off regular lower body clothing?: A Little 6 Click Score: 18   End of Session Nurse Communication: Mobility status  Activity Tolerance: Patient tolerated treatment well Patient left: in bed;with call bell/phone within reach  OT Visit Diagnosis: Other abnormalities of gait and mobility (R26.89)                Time: 8974-8957 OT Time Calculation (min): 17 min Charges:  OT General Charges $OT Visit: 1 Visit OT Evaluation $OT Eval Moderate Complexity: 1 Mod  Ronal Gift E. Mariachristina Holle, OTR/L Acute Rehabilitation Services  [phone]   Ronal Mallie Salt 12/30/2023, 12:40 PM

## 2023-12-30 NOTE — ED Notes (Signed)
 Pt informed staff he does not eat pork. Service response called to order new breakfast tray with no pork.

## 2023-12-30 NOTE — Progress Notes (Signed)
 [facility] New Study Start: LIBREXIA-STROKE   SUMMARY For more information refer to: [email or web address] Study Identifier: [id number] A Phase 3, Randomized, Double-Blind, Parallel-Group, Placebo-Controlled Study to Demonstrate the Efficacy and Safety of Milvexian, an Oral Factor XIa Inhibitor, for Stroke Prevention after an Acute Ischemic Stroke or High-Risk Transient Ischemic Attack  Brief Summary This study will evaluate the efficacy and safety of milvexian in participants after an acute ischemic stroke or high-risk TIA who are receiving antiplatelet therapy standard-of-care.  Design Phase 3, Randomized, Double-Blind, Interventional, Event-Driven  Intervention [id number] (Milvexian) 25 mg or Placebo     Concomitant Therapy Participants will receive SAPT or DAPT. The SAPT or DAPT may be started prior to randomization and the type of antiplatelet agent(s), and duration of treatment will be at the discretion of the investigator. If ASA is used, it will be limited to low dose (75 to 100 mg/day) NSAID (except ASA) may be used concomitantly on a temporary basis but should be avoided for chronic use more than 4 weeks of consecutive therapy)  Prohibited Therapy Chronic (>4 weeks of consecutive use) use of ASA >100 mg per day Current or planned use of isoniazid Concomitant use of omeprazole or esomeprazole with clopidogrel is prohibited. Other use of PPI is allowed and encouraged Additional anticoagulants (e.g., vitamin k antagonists, factor IIa or FXa inhibitors) Use of a combined P-gp and strong CYP3A4 inhibitor (e.g., atazanavir, clarithromycin, itraconazole, ketoconazole, ritonavir, saquinavir) within 7 days of receiving study intervention and during the study is prohibited Use of a combined P-gp and strong CYP3A4 inducer (e.g., carbamazepine, phenytoin, rifampin) within 7 days of receiving study intervention and during the study is prohibited * Prohibited therapies  may be administered on a temporary basis, and if administered, the investigator should discontinue the study intervention. Study intervention may be restarted after the prohibited therapy has been discontinued and after the completion of a suitable washout period at the investigator's discretion  Anticoagulation Prophylaxis The use of anticoagulants for post-stroke DVT prophylaxis after the 3-day window is prohibited and non-pharmacological prophylaxis (e.g., intermittent pneumatic compression) is recommended.   Potential Drug-Drug Interactions Milvexian metabolism: Substrate of CYP3A4; use caution with coadministration of strong CYP3A4 inducers and inhibitors  Administration  Take 1 tablet by mouth twice daily without regards to food intake, at approximately the same time each day. For participants unable to swallow medication, the tablet can be dispersed in water and given via NG tube or in applesauce.  Missed dose If a dose of medication is missed, the dose should be taken as soon as possible. If the missed dose cannot be taken at regular time, next dose should not be doubled. Resume at the next scheduled dose      Plan: Start [Milvexian 25 mg tablets or placebo] today. Study medication must be picked up from pharmacy, medication can not be tubed.    Please contact [facility] if any questions or concerns regarding the study medication.    Mardene Sayer, PharmD, BCPS [facility] Pharmacist  [phone]

## 2023-12-30 NOTE — Plan of Care (Signed)
   Problem: Coping: Goal: Ability to adjust to condition or change in health will improve Outcome: Progressing   Problem: Fluid Volume: Goal: Ability to maintain a balanced intake and output will improve Outcome: Progressing

## 2023-12-30 NOTE — Progress Notes (Signed)
  Echocardiogram 2D Echocardiogram has been performed.  Jonathan Stone 12/30/2023, 3:22 PM

## 2023-12-30 NOTE — Progress Notes (Addendum)
 STROKE TEAM PROGRESS NOTE   INTERIM HISTORY/SUBJECTIVE Son at the bedside. Strength is good, speech is still slightly dysarthric. Follows with his PCP regularly   OBJECTIVE  CBC    Component Value Date/Time   WBC 9.9 12/30/2023 0411   RBC 5.66 12/30/2023 0411   HGB 15.4 12/30/2023 0411   HGB 15.5 05/04/2022 0929   HCT 47.6 12/30/2023 0411   HCT 46.6 05/04/2022 0929   PLT 144 (L) 12/30/2023 0411   PLT 154 05/04/2022 0929   MCV 84.1 12/30/2023 0411   MCV 83 05/04/2022 0929   MCH 27.2 12/30/2023 0411   MCHC 32.4 12/30/2023 0411   RDW 13.3 12/30/2023 0411   RDW 13.0 05/04/2022 0929   LYMPHSABS 4.4 (H) 12/29/2023 1409   LYMPHSABS 3.1 05/04/2022 0929   MONOABS 0.8 12/29/2023 1409   EOSABS 0.2 12/29/2023 1409   EOSABS 0.2 05/04/2022 0929   BASOSABS 0.0 12/29/2023 1409   BASOSABS 0.0 05/04/2022 0929    BMET    Component Value Date/Time   NA 138 12/30/2023 0411   NA 140 05/04/2022 0929   K 3.8 12/30/2023 0411   CL 106 12/30/2023 0411   CO2 23 12/30/2023 0411   GLUCOSE 103 (H) 12/30/2023 0411   BUN 11 12/30/2023 0411   BUN 14 05/04/2022 0929   CREATININE 1.25 (H) 12/30/2023 0411   CREATININE 1.12 09/22/2015 1450   CALCIUM  8.7 (L) 12/30/2023 0411   EGFR 69 05/04/2022 0929   GFRNONAA >60 12/30/2023 0411    IMAGING past 24 hours CT ANGIO HEAD NECK W WO CM Result Date: 12/30/2023 CLINICAL DATA:  Initial evaluation for acute neuro deficit, stroke. EXAM: CT ANGIOGRAPHY HEAD AND NECK WITH AND WITHOUT CONTRAST TECHNIQUE: Multidetector CT imaging of the head and neck was performed using the standard protocol during bolus administration of intravenous contrast. Multiplanar CT image reconstructions and MIPs were obtained to evaluate the vascular anatomy. Carotid stenosis measurements (when applicable) are obtained utilizing NASCET criteria, using the distal internal carotid diameter as the denominator. RADIATION DOSE REDUCTION: This exam was performed according to the departmental  dose-optimization program which includes automated exposure control, adjustment of the mA and/or kV according to patient size and/or use of iterative reconstruction technique. CONTRAST:  75mL OMNIPAQUE IOHEXOL 350 MG/ML SOLN COMPARISON:  Comparison made with CT of MRI from earlier the same day. FINDINGS: CTA NECK FINDINGS Aortic arch: Standard branching. Imaged portion shows no evidence of aneurysm or dissection. No significant stenosis of the major arch vessel origins. Right carotid system: No evidence of dissection, stenosis (50% or greater), or occlusion. Left carotid system: No evidence of dissection, stenosis (50% or greater), or occlusion. Vertebral arteries: Both vertebral arteries arise from subclavian arteries. Left vertebral artery partially obscured due to adjacent venous contamination. Visualized vertebral arteries patent without stenosis or dissection. Skeleton: No worrisome osseous lesions. Mild-to-moderate spondylosis at C5-6 and C6-7. Other neck: No other acute finding. Upper chest: No other acute finding. Review of the MIP images confirms the above findings CTA HEAD FINDINGS Anterior circulation: Both internal carotid arteries widely patent to the termini without stenosis. A1 segments widely patent. Normal anterior communicating artery complex. Both anterior cerebral arteries widely patent to their distal aspects without stenosis. No M1 stenosis or occlusion. Normal MCA bifurcations. Distal MCA branches well perfused and symmetric. Posterior circulation: Both V4 segments are diminutive but patent without stenosis. Left vertebral artery slightly dominant. Both PICA patent at their origins. Basilar markedly diminutive but patent without stenosis. Superior cerebral arteries patent bilaterally. Fetal type  origin of the PCAs. PCAs patent to their distal aspects without stenosis. Venous sinuses: Grossly patent allowing for timing the contrast bolus. Anatomic variants: Fetal type origin of the PCAs with  overall diminutive vertebrobasilar system. No aneurysm. Review of the MIP images confirms the above findings IMPRESSION: 1. Normal CTA of the head and neck. No large vessel occlusion or other emergent finding. No hemodynamically significant or correctable stenosis. 2. Fetal type origin of the PCAs with overall diminutive vertebrobasilar system. Electronically Signed   By: Morene Hoard M.D.   On: 12/30/2023 00:07   MR BRAIN WO CONTRAST Result Date: 12/29/2023 CLINICAL DATA:  Initial evaluation for acute neuro deficit, stroke suspected. EXAM: MRI HEAD WITHOUT CONTRAST TECHNIQUE: Multiplanar, multiecho pulse sequences of the brain and surrounding structures were obtained without intravenous contrast. COMPARISON:  CT from earlier the same day. FINDINGS: Brain: Cerebral volume within normal limits. Mild patchy T2/FLAIR signal abnormality seen involving the periventricular and deep white matter, consistent with chronic small vessel ischemic disease, mild in nature. 2.6 cm focus of restricted diffusion seen involving the right basal ganglia, consistent with an acute to early subacute perforator type infarct (series 7, image 60). Associated mild petechial blood products without hemorrhagic transformation or significant regional mass effect. No other evidence for acute or subacute ischemia. Gray-white matter differentiation otherwise maintained. No areas of chronic cortical infarction. No other acute or chronic intracranial blood products. No mass lesion, midline shift or mass effect no hydrocephalus or extra-axial fluid collection. Pituitary gland within normal limits. Vascular: Major intracranial vascular flow voids are maintained. Skull and upper cervical spine: Craniocervical junction within normal limits. Decreased T1 signal intensity within the bone marrow of the upper cervical spine, nonspecific, but most commonly related to anemia, smoking or obesity. No scalp soft tissue abnormality. Sinuses/Orbits:  Globes and orbital soft tissues within normal limits. Mild scattered mucosal thickening present about the sphenoid ethmoidal sinuses. Paranasal sinuses are otherwise clear. No mastoid effusion. Other: None. IMPRESSION: 1. 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 Associated mild petechial blood products without hemorrhagic transformation or significant regional mass effect. 2. Underlying mild chronic microvascular ischemic disease. Electronically Signed   By: Morene Hoard M.D.   On: 12/29/2023 21:22   CT HEAD WO CONTRAST Result Date: 12/29/2023 CLINICAL DATA:  Left-sided facial numbness and left arm weakness per epic notes EXAM: CT HEAD WITHOUT CONTRAST TECHNIQUE: Contiguous axial images were obtained from the base of the skull through the vertex without intravenous contrast. RADIATION DOSE REDUCTION: This exam was performed according to the departmental dose-optimization program which includes automated exposure control, adjustment of the mA and/or kV according to patient size and/or use of iterative reconstruction technique. COMPARISON:  None Available. FINDINGS: Brain: No hemorrhage or intracranial mass. Focal hypodensity within the right basal ganglia and extending into the periventricular white matter. No midline shift or significant mass effect. Mild atrophy. Patchy white matter hypodensities suggestive of mild chronic small vessel disease. Nonenlarged ventricles Vascular: No hyperdense vessels.  No unexpected calcification Skull: Normal. Negative for fracture or focal lesion. Sinuses/Orbits: No acute finding. Other: None IMPRESSION: 1. Focal hypodensity within the right basal ganglia and extending into the periventricular white matter, suspicious for acute infarct. No hemorrhage or mass effect. 2. Mild atrophy and chronic small vessel disease. Electronically Signed   By: Luke Bun M.D.   On: 12/29/2023 15:23    Vitals:   12/30/23 0000  12/30/23 0156 12/30/23 0400 12/30/23 0728  BP: (!) 168/86 ROLLEN)  146/85 (!) 150/78 (!) 164/83  Pulse: (!) 58 (!) 46 (!) 48 (!) 57  Resp: 15 (!) 3 12 18   Temp:  98.9 F (37.2 C) 98.7 F (37.1 C) 97.8 F (36.6 C)  TempSrc:  Oral Oral Oral  SpO2: 98% 98% 97% 98%  Weight:      Height:         PHYSICAL EXAM General:  Alert, well-nourished, well-developed patient in no acute distress Psych:  Mood and affect appropriate for situation CV: Regular rate and rhythm on monitor Respiratory:  Regular, unlabored respirations on room air GI: Abdomen soft and nontender   NEURO:  Mental Status: AA&Ox3, patient is able to give clear and coherent history Speech/Language: speech is mildly dysarthric.  Naming, fluency, and comprehension intact. Some difficulty with repetition   Cranial Nerves:  II: PERRL. Visual fields full.  III, IV, VI: EOMI. Eyelids elevate symmetrically.  V: Sensation is intact to light touch and symmetrical to face.  VII: slight left facial droop  VIII: hearing intact to voice. IX, X: Palate elevates symmetrically. Phonation is normal.  KP:Dynloizm shrug 5/5. XII: tongue is slightly to the right Motor: 5/5 strength to all muscle groups tested on the right and left. No drift.  Tone: is normal and bulk is normal Sensation- Intact to light touch bilaterally. Extinction absent to light touch to DSS.   Coordination: FTN intact bilaterally, HKS: no ataxia in BLE.No drift.  Gait- deferred  NIH   ASSESSMENT/PLAN  Mr. Jonathan Stone is a 68 y.o. male with history of HTN, HLD, obesity, who presents with left facial droop, left arm numbness and slurred speech.  NIH on Admission 2  Stroke:  right basal ganglia infarct, etiology:  small vessel disease given uncontrolled risk factors vs ESUS due to the size > 2.0cm   Code Stroke CT head - Focal hypodensity within the right basal ganglia and extending into the periventricular white matter, suspicious for acute infarct. CTA head & neck  Normal CTA of the head and neck. Fetal type origin of the PCAs with overall diminutive vertebrobasilar system.   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 Associated mild petechial blood products 2D Echo EF 60-65%, There is severe concentric left ventricular hypertrophy.  LDL 85 HgbA1c 7.2 UDS neg VTE prophylaxis - lovenox aspirin 81 mg daily prior to admission, now on aspirin 81 mg daily and clopidogrel 75 mg daily for 3 weeks and then plavix alone. Therapy recommendations: None Disposition:   pending  Hypertension Home meds:  lisinopril , norvasc  High on presentation 200s Stable now on the high end On amlodipine  10 and metoprolol  25 Long term BP goal normotensive  Hyperlipidemia Home meds: not compliant with crestor  LDL 85, goal < 70 Now on crestor  20 Continue statin at discharge  Diabetes type II Uncontrolled Home meds:  Jardiance , metformin   HgbA1c 7.2, goal < 7.0 CBGs SSI Recommend close follow-up with PCP for better DM control  Other stroke risk factors Advanced age  Other medical issues AKI, creatinine 1.5-1.25 Mild leukocytosis WBC 11.6-9.9   Hospital day # 1  Patient seen and examined by NP/APP with MD. MD to update note as needed.   Jorene Last, DNP, FNP-BC [facility] Pager: [phone]  ATTENDING NOTE: I reviewed above note and agree with the assessment and plan. Pt was seen and examined.   Son at bedside initially and then daughter at bedside.  Patient lying in bed, still has mild left facial  droop and slurred speech, otherwise neuro intact.  Educated on stroke risk factor modification.  No long DAPT and statin.  BP on the higher end, started amlodipine  and metoprolol .  Will discuss about Librexia stroke study.  PT and OT no recommendation.  For detailed assessment and plan, please refer to above as I have made changes wherever appropriate.   Ary Cummins, MD PhD Stroke  Neurology 12/30/2023 5:40 PM   To contact Stroke Continuity provider, please refer to [email or web address]. After hours, contact General Neurology

## 2023-12-30 NOTE — ED Notes (Signed)
 Echo at bedside

## 2023-12-30 NOTE — Research (Signed)
 PATIENT: Jonathan Stone DOB: 1955/12/30  Jonathan Stone is a 68 y.o. male who meets inclusion and exclusion criteria and may be a potential candidate for Librexia stroke study.  A brief description of the study's purpose and duration was provided to see if the patient was interested in participating. The patient was told that study participation is voluntary, and if he choose not to participate, he will continue receiving the same treatment as any other patient.   Since the patient seemed interested in the trial, a copy of the informed consent was provided to him at  12:15pm today and I said I would come back later to discuss the study in more detail after 2 hours had time to read the informed consent.  Detailed discussion  I returned at 2:25pm today to discuss the study in detail.  I requested him ask any questions he may have as we reviewed the informed consent so I could answer them as we went along. I discussed the standard-of-care treatments, appropriate alternative treatments, procedures or devices that might be helpful to the patients, the clinical trial, and each option's relative risks and benefits and alternatives. We discussed the research participant's bill of rights. Then, we also reviewed the ICF page by page and discussed their responsibilities if they choose to participate, any compensation, and what medical treatment is available if injury occurs. Jonathan Stone was encouraged to discuss study participation with family, significant others, and primary care attending or physician to help reduce the possibility of undue influence by talking to the investigator alone.  The patient was reminded that the study participation is voluntary, and if he choose not to participate, he will continue receiving the same care as any other patient. Additionally, if he choose to participate and later decide he want to withdraw, he will continue to be treated as any other patient. Jonathan Stone was told that he  did not have to decide now.  Jonathan Stone answered questions appropriately to verbalize understanding of the informed consent.   The subject agreed to participate in the Librexia stroke trial and signed the Research Participants Zell of Rights and the informed consent at 2:35pm today.  The informed consent was obtained prior to performance of any protocol-specific procedures for the subject.  A copy of the Research Participants Zell of Rights and signed informed consent was given to the subject, and a copy placed in the subject's medical record.   Jonathan Cummins, MD PhD Stroke Neurology 12/30/2023 4:35 PM

## 2023-12-30 NOTE — ED Notes (Signed)
 Neuro at bedside assessing patient.

## 2023-12-30 NOTE — Progress Notes (Signed)
 PROGRESS NOTE    Jonathan Stone  FMW:[id number] DOB: 03/28/1955 DOA: 12/29/2023 PCP: Joyce Norleen BROCKS, MD    Brief Narrative:  Mohit Zirbes is a 68 y.o. male with medical history significant for  HTN, HLD, obesity and T2DM who presented to the ED for evaluation of left-sided facial droop, left arm weakness and numbness and admitted for acute cerebrovascular accident. Drives trucks for a living   Assessment and Plan: Acute CVA - Patient presented with acute onset of slurred speech, left facial droop and numbness, left arm weakness and numbness - CTH shows focal right basal ganglia hypodensity concerning for acute infarct. MRI brain confirms a 2.7 cm acute or early subacute perforator type infarct involving the right basal ganglia with mild petechial blood products without hemorrhagic transformation or significant regional mass effect - neurology consult - Initating DAPT with aspirin and Plavix for 3 weeks then aspirin alone - Patient and family educated and counseled on the importance of controlling patient's stroke risk factors - echo pending -PT/OT pending   Hypertensive urgency - BP significantly elevated with SBP in the 190s to 220s - will slowly resume BP meds at lower doses - Long-term BP goal <130/80   T2DM - Last A1c 7.1% last month - Blood sugar 168 on admission - resume home meds   AKI - Slight bump in creatinine to 1.49 from normal baseline - improving with IVF   HLD - Continue rosuvastatin  - LDL: 85   DVT prophylaxis: enoxaparin (LOVENOX) injection 40 mg Start: 12/29/23 2145    Code Status: Full Code Family Communication:   Disposition Plan:  Level of care: Progressive Status is: Inpatient     Consultants:  neurology   Subjective: No current complaints  Objective: Vitals:   12/30/23 0400 12/30/23 0728 12/30/23 1030 12/30/23 1115  BP: (!) 150/78 (!) 164/83 (!) 179/91 (!) 197/98  Pulse: (!) 48 (!) 57 78 71  Resp: 12 18 16 19   Temp: 98.7 F  (37.1 C) 97.8 F (36.6 C)  98.5 F (36.9 C)  TempSrc: Oral Oral Oral Oral  SpO2: 97% 98% 98% 100%  Weight:      Height:        Intake/Output Summary (Last 24 hours) at 12/30/2023 1203 Last data filed at 12/30/2023 1116 Gross per 24 hour  Intake 1000 ml  Output 450 ml  Net 550 ml   Filed Weights   12/29/23 1402  Weight: 81.6 kg    Examination:   General: Appearance:     Overweight male in no acute distress     Lungs:    respirations unlabored  Heart:    Normal heart rate. Normal rhythm. No murmurs, rubs, or gallops.    MS:   All extremities are intact.    Neurologic:   Awake, alert, oriented x 3. No apparent focal neurological           defect.        Data Reviewed: I have personally reviewed following labs and imaging studies  CBC: Recent Labs  Lab 12/29/23 1409 12/29/23 1427 12/30/23 0411  WBC 11.6*  --  9.9  NEUTROABS 6.2  --   --   HGB 16.9 17.7* 15.4  HCT 51.1 52.0 47.6  MCV 82.8  --  84.1  PLT 188  --  144*   Basic Metabolic Panel: Recent Labs  Lab 12/29/23 1409 12/29/23 1427 12/30/23 0411  NA 140 140 138  K 4.5 4.8 3.8  CL 100 102 106  CO2 31  --  23  GLUCOSE 171* 165* 103*  BUN 13 16 11   CREATININE 1.49* 1.50* 1.25*  CALCIUM  9.8  --  8.7*   GFR: Estimated Creatinine Clearance: 56.7 mL/min (A) (by C-G formula based on SCr of 1.25 mg/dL (H)). Liver Function Tests: Recent Labs  Lab 12/29/23 1409  AST 26  ALT 19  ALKPHOS 83  BILITOT 1.1  PROT 7.4  ALBUMIN 4.1   No results for input(s): LIPASE, AMYLASE in the last 168 hours. No results for input(s): AMMONIA in the last 168 hours. Coagulation Profile: Recent Labs  Lab 12/29/23 1409  INR 1.0   Cardiac Enzymes: No results for input(s): CKTOTAL, CKMB, CKMBINDEX, TROPONINI in the last 168 hours. BNP (last 3 results) No results for input(s): PROBNP in the last 8760 hours. HbA1C: Recent Labs    12/30/23 0410  HGBA1C 7.2*   CBG: Recent Labs  Lab  12/29/23 1456 12/29/23 2145 12/30/23 0740 12/30/23 1134  GLUCAP 168* 130* 102* 108*   Lipid Profile: Recent Labs    12/30/23 0411  CHOL 141  HDL 36*  LDLCALC 85  TRIG 98  CHOLHDL 3.9   Thyroid Function Tests: No results for input(s): TSH, T4TOTAL, FREET4, T3FREE, THYROIDAB in the last 72 hours. Anemia Panel: No results for input(s): VITAMINB12, FOLATE, FERRITIN, TIBC, IRON, RETICCTPCT in the last 72 hours. Sepsis Labs: No results for input(s): PROCALCITON, LATICACIDVEN in the last 168 hours.  No results found for this or any previous visit (from the past 240 hours).       Radiology Studies: CT ANGIO HEAD NECK W WO CM Result Date: 12/30/2023 CLINICAL DATA:  Initial evaluation for acute neuro deficit, stroke. EXAM: CT ANGIOGRAPHY HEAD AND NECK WITH AND WITHOUT CONTRAST TECHNIQUE: Multidetector CT imaging of the head and neck was performed using the standard protocol during bolus administration of intravenous contrast. Multiplanar CT image reconstructions and MIPs were obtained to evaluate the vascular anatomy. Carotid stenosis measurements (when applicable) are obtained utilizing NASCET criteria, using the distal internal carotid diameter as the denominator. RADIATION DOSE REDUCTION: This exam was performed according to the departmental dose-optimization program which includes automated exposure control, adjustment of the mA and/or kV according to patient size and/or use of iterative reconstruction technique. CONTRAST:  75mL OMNIPAQUE IOHEXOL 350 MG/ML SOLN COMPARISON:  Comparison made with CT of MRI from earlier the same day. FINDINGS: CTA NECK FINDINGS Aortic arch: Standard branching. Imaged portion shows no evidence of aneurysm or dissection. No significant stenosis of the major arch vessel origins. Right carotid system: No evidence of dissection, stenosis (50% or greater), or occlusion. Left carotid system: No evidence of dissection, stenosis (50% or  greater), or occlusion. Vertebral arteries: Both vertebral arteries arise from subclavian arteries. Left vertebral artery partially obscured due to adjacent venous contamination. Visualized vertebral arteries patent without stenosis or dissection. Skeleton: No worrisome osseous lesions. Mild-to-moderate spondylosis at C5-6 and C6-7. Other neck: No other acute finding. Upper chest: No other acute finding. Review of the MIP images confirms the above findings CTA HEAD FINDINGS Anterior circulation: Both internal carotid arteries widely patent to the termini without stenosis. A1 segments widely patent. Normal anterior communicating artery complex. Both anterior cerebral arteries widely patent to their distal aspects without stenosis. No M1 stenosis or occlusion. Normal MCA bifurcations. Distal MCA branches well perfused and symmetric. Posterior circulation: Both V4 segments are diminutive but patent without stenosis. Left vertebral artery slightly dominant. Both PICA patent at their origins. Basilar markedly diminutive but patent without stenosis. Superior cerebral arteries patent  bilaterally. Fetal type origin of the PCAs. PCAs patent to their distal aspects without stenosis. Venous sinuses: Grossly patent allowing for timing the contrast bolus. Anatomic variants: Fetal type origin of the PCAs with overall diminutive vertebrobasilar system. No aneurysm. Review of the MIP images confirms the above findings IMPRESSION: 1. Normal CTA of the head and neck. No large vessel occlusion or other emergent finding. No hemodynamically significant or correctable stenosis. 2. Fetal type origin of the PCAs with overall diminutive vertebrobasilar system. Electronically Signed   By: Morene Hoard M.D.   On: 12/30/2023 00:07   MR BRAIN WO CONTRAST Result Date: 12/29/2023 CLINICAL DATA:  Initial evaluation for acute neuro deficit, stroke suspected. EXAM: MRI HEAD WITHOUT CONTRAST TECHNIQUE: Multiplanar, multiecho pulse  sequences of the brain and surrounding structures were obtained without intravenous contrast. COMPARISON:  CT from earlier the same day. FINDINGS: Brain: Cerebral volume within normal limits. Mild patchy T2/FLAIR signal abnormality seen involving the periventricular and deep white matter, consistent with chronic small vessel ischemic disease, mild in nature. 2.6 cm focus of restricted diffusion seen involving the right basal ganglia, consistent with an acute to early subacute perforator type infarct (series 7, image 60). Associated mild petechial blood products without hemorrhagic transformation or significant regional mass effect. No other evidence for acute or subacute ischemia. Gray-white matter differentiation otherwise maintained. No areas of chronic cortical infarction. No other acute or chronic intracranial blood products. No mass lesion, midline shift or mass effect no hydrocephalus or extra-axial fluid collection. Pituitary gland within normal limits. Vascular: Major intracranial vascular flow voids are maintained. Skull and upper cervical spine: Craniocervical junction within normal limits. Decreased T1 signal intensity within the bone marrow of the upper cervical spine, nonspecific, but most commonly related to anemia, smoking or obesity. No scalp soft tissue abnormality. Sinuses/Orbits: Globes and orbital soft tissues within normal limits. Mild scattered mucosal thickening present about the sphenoid ethmoidal sinuses. Paranasal sinuses are otherwise clear. No mastoid effusion. Other: None. IMPRESSION: 1. 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 Associated mild petechial blood products without hemorrhagic transformation or significant regional mass effect. 2. Underlying mild chronic microvascular ischemic disease. Electronically Signed   By: Morene Hoard M.D.   On: 12/29/2023 21:22   CT HEAD WO CONTRAST Result Date: 12/29/2023 CLINICAL DATA:   Left-sided facial numbness and left arm weakness per epic notes EXAM: CT HEAD WITHOUT CONTRAST TECHNIQUE: Contiguous axial images were obtained from the base of the skull through the vertex without intravenous contrast. RADIATION DOSE REDUCTION: This exam was performed according to the departmental dose-optimization program which includes automated exposure control, adjustment of the mA and/or kV according to patient size and/or use of iterative reconstruction technique. COMPARISON:  None Available. FINDINGS: Brain: No hemorrhage or intracranial mass. Focal hypodensity within the right basal ganglia and extending into the periventricular white matter. No midline shift or significant mass effect. Mild atrophy. Patchy white matter hypodensities suggestive of mild chronic small vessel disease. Nonenlarged ventricles Vascular: No hyperdense vessels.  No unexpected calcification Skull: Normal. Negative for fracture or focal lesion. Sinuses/Orbits: No acute finding. Other: None IMPRESSION: 1. Focal hypodensity within the right basal ganglia and extending into the periventricular white matter, suspicious for acute infarct. No hemorrhage or mass effect. 2. Mild atrophy and chronic small vessel disease. Electronically Signed   By: Luke Bun M.D.   On: 12/29/2023 15:23        Scheduled Meds:  aspirin EC  81 mg Oral  Daily   clopidogrel  75 mg Oral Daily   empagliflozin   25 mg Oral Daily   enoxaparin (LOVENOX) injection  40 mg Subcutaneous Q24H   insulin aspart  0-15 Units Subcutaneous TID WC   insulin aspart  0-5 Units Subcutaneous QHS   [START ON 01/01/2024] metFORMIN   850 mg Oral BID WC   metoprolol  succinate  25 mg Oral Q2000   rosuvastatin   20 mg Oral Daily   Continuous Infusions:   LOS: 1 day    Time spent: 45 minutes spent on chart review, discussion with nursing staff, consultants, updating family and interview/physical exam; more than 50% of that time was spent in counseling and/or  coordination of care.    Harlene RAYMOND Bowl, DO [facility] Available via Epic secure chat 7am-7pm After these hours, please refer to coverage provider listed on [email or web address] 12/30/2023, 12:03 PM

## 2023-12-31 ENCOUNTER — Other Ambulatory Visit (HOSPITAL_COMMUNITY): Payer: Self-pay

## 2023-12-31 DIAGNOSIS — I639 Cerebral infarction, unspecified: Secondary | ICD-10-CM | POA: Diagnosis not present

## 2023-12-31 LAB — CBC
HCT: 46.7 % (ref 39.0–52.0)
Hemoglobin: 15.5 g/dL (ref 13.0–17.0)
MCH: 27.3 pg (ref 26.0–34.0)
MCHC: 33.2 g/dL (ref 30.0–36.0)
MCV: 82.4 fL (ref 80.0–100.0)
Platelets: 132 K/uL — ABNORMAL LOW (ref 150–400)
RBC: 5.67 MIL/uL (ref 4.22–5.81)
RDW: 13.2 % (ref 11.5–15.5)
WBC: 7.9 K/uL (ref 4.0–10.5)
nRBC: 0 % (ref 0.0–0.2)

## 2023-12-31 LAB — BASIC METABOLIC PANEL WITH GFR
Anion gap: 10 (ref 5–15)
BUN: 17 mg/dL (ref 8–23)
CO2: 26 mmol/L (ref 22–32)
Calcium: 8.9 mg/dL (ref 8.9–10.3)
Chloride: 104 mmol/L (ref 98–111)
Creatinine, Ser: 1.34 mg/dL — ABNORMAL HIGH (ref 0.61–1.24)
GFR, Estimated: 58 mL/min — ABNORMAL LOW (ref >60)
Glucose, Bld: 105 mg/dL — ABNORMAL HIGH (ref 70–99)
Potassium: 3.9 mmol/L (ref 3.5–5.1)
Sodium: 140 mmol/L (ref 135–145)

## 2023-12-31 LAB — GLUCOSE, CAPILLARY
Glucose-Capillary: 111 mg/dL — ABNORMAL HIGH (ref 70–99)
Glucose-Capillary: 121 mg/dL — ABNORMAL HIGH (ref 70–99)

## 2023-12-31 MED ORDER — ROSUVASTATIN CALCIUM 20 MG PO TABS
20.0000 mg | ORAL_TABLET | Freq: Every day | ORAL | 0 refills | Status: AC
  Filled 2023-12-31: qty 30, 30d supply, fill #0

## 2023-12-31 MED ORDER — METOPROLOL SUCCINATE ER 100 MG PO TB24
50.0000 mg | ORAL_TABLET | Freq: Every day | ORAL | 0 refills | Status: DC
  Filled 2023-12-31: qty 15, 30d supply, fill #0

## 2023-12-31 MED ORDER — CLOPIDOGREL BISULFATE 75 MG PO TABS
75.0000 mg | ORAL_TABLET | Freq: Every day | ORAL | 1 refills | Status: AC
  Filled 2023-12-31: qty 30, 30d supply, fill #0

## 2023-12-31 MED ORDER — ASPIRIN 81 MG PO TBEC
81.0000 mg | DELAYED_RELEASE_TABLET | Freq: Every day | ORAL | 12 refills | Status: DC
  Filled 2023-12-31: qty 30, 30d supply, fill #0

## 2023-12-31 MED ORDER — STUDY - LIBREXIA-STROKE - MILVEXIAN 25 MG OR PLACEBO TABLET (PI-SETHI): 1.0000 | ORAL_TABLET | Freq: Two times a day (BID) | ORAL | 0 refills | Status: DC

## 2023-12-31 NOTE — Plan of Care (Signed)

## 2023-12-31 NOTE — Plan of Care (Signed)
 Problem: Education: Goal: Ability to describe self-care measures that may prevent or decrease complications (Diabetes Survival Skills Education) will improve 12/31/2023 1106 by Jenel Bobetta SAILOR, RN Outcome: Adequate for Discharge 12/31/2023 1036 by Jenel Bobetta SAILOR, RN Outcome: Progressing Goal: Individualized Educational Video(s) 12/31/2023 1106 by Jenel Bobetta SAILOR, RN Outcome: Adequate for Discharge 12/31/2023 1036 by Jenel Bobetta SAILOR, RN Outcome: Progressing   Problem: Coping: Goal: Ability to adjust to condition or change in health will improve 12/31/2023 1106 by Jenel Bobetta SAILOR, RN Outcome: Adequate for Discharge 12/31/2023 1036 by Jenel Bobetta SAILOR, RN Outcome: Progressing   Problem: Fluid Volume: Goal: Ability to maintain a balanced intake and output will improve 12/31/2023 1106 by Jenel Bobetta SAILOR, RN Outcome: Adequate for Discharge 12/31/2023 1036 by Jenel Bobetta SAILOR, RN Outcome: Progressing   Problem: Health Behavior/Discharge Planning: Goal: Ability to identify and utilize available resources and services will improve 12/31/2023 1106 by Jenel Bobetta SAILOR, RN Outcome: Adequate for Discharge 12/31/2023 1036 by Jenel Bobetta SAILOR, RN Outcome: Progressing Goal: Ability to manage health-related needs will improve 12/31/2023 1106 by Jenel Bobetta SAILOR, RN Outcome: Adequate for Discharge 12/31/2023 1036 by Jenel Bobetta SAILOR, RN Outcome: Progressing   Problem: Metabolic: Goal: Ability to maintain appropriate glucose levels will improve 12/31/2023 1106 by Jenel Bobetta SAILOR, RN Outcome: Adequate for Discharge 12/31/2023 1036 by Jenel Bobetta SAILOR, RN Outcome: Progressing   Problem: Nutritional: Goal: Maintenance of adequate nutrition will improve 12/31/2023 1106 by Jenel Bobetta SAILOR, RN Outcome: Adequate for Discharge 12/31/2023 1036 by Jenel Bobetta SAILOR, RN Outcome: Progressing Goal: Progress toward achieving an optimal weight will improve 12/31/2023 1106 by Jenel Bobetta SAILOR, RN Outcome:  Adequate for Discharge 12/31/2023 1036 by Jenel Bobetta SAILOR, RN Outcome: Progressing   Problem: Skin Integrity: Goal: Risk for impaired skin integrity will decrease 12/31/2023 1106 by Jenel Bobetta SAILOR, RN Outcome: Adequate for Discharge 12/31/2023 1036 by Jenel Bobetta SAILOR, RN Outcome: Progressing   Problem: Tissue Perfusion: Goal: Adequacy of tissue perfusion will improve 12/31/2023 1106 by Jenel Bobetta SAILOR, RN Outcome: Adequate for Discharge 12/31/2023 1036 by Jenel Bobetta SAILOR, RN Outcome: Progressing   Problem: Education: Goal: Knowledge of disease or condition will improve 12/31/2023 1106 by Jenel Bobetta SAILOR, RN Outcome: Adequate for Discharge 12/31/2023 1036 by Jenel Bobetta SAILOR, RN Outcome: Progressing Goal: Knowledge of secondary prevention will improve (MUST DOCUMENT ALL) 12/31/2023 1106 by Jenel Bobetta SAILOR, RN Outcome: Adequate for Discharge 12/31/2023 1036 by Jenel Bobetta SAILOR, RN Outcome: Progressing Goal: Knowledge of patient specific risk factors will improve (DELETE if not current risk factor) 12/31/2023 1106 by Jenel Bobetta SAILOR, RN Outcome: Adequate for Discharge 12/31/2023 1036 by Jenel Bobetta SAILOR, RN Outcome: Progressing   Problem: Ischemic Stroke/TIA Tissue Perfusion: Goal: Complications of ischemic stroke/TIA will be minimized 12/31/2023 1106 by Jenel Bobetta SAILOR, RN Outcome: Adequate for Discharge 12/31/2023 1036 by Jenel Bobetta SAILOR, RN Outcome: Progressing   Problem: Coping: Goal: Will verbalize positive feelings about self 12/31/2023 1106 by Jenel Bobetta SAILOR, RN Outcome: Adequate for Discharge 12/31/2023 1036 by Jenel Bobetta SAILOR, RN Outcome: Progressing Goal: Will identify appropriate support needs 12/31/2023 1106 by Jenel Bobetta SAILOR, RN Outcome: Adequate for Discharge 12/31/2023 1036 by Jenel Bobetta SAILOR, RN Outcome: Progressing   Problem: Health Behavior/Discharge Planning: Goal: Ability to manage health-related needs will improve 12/31/2023 1106 by Jenel Bobetta SAILOR, RN Outcome: Adequate for Discharge 12/31/2023 1036 by Jenel Bobetta SAILOR, RN Outcome: Progressing Goal: Goals will be collaboratively established with patient/family 12/31/2023 1106 by Jenel Bobetta SAILOR, RN  Outcome: Adequate for Discharge 12/31/2023 1036 by Jenel Bobetta SAILOR, RN Outcome: Progressing   Problem: Self-Care: Goal: Ability to participate in self-care as condition permits will improve 12/31/2023 1106 by Jenel Bobetta SAILOR, RN Outcome: Adequate for Discharge 12/31/2023 1036 by Jenel Bobetta SAILOR, RN Outcome: Progressing Goal: Verbalization of feelings and concerns over difficulty with self-care will improve 12/31/2023 1106 by Jenel Bobetta SAILOR, RN Outcome: Adequate for Discharge 12/31/2023 1036 by Jenel Bobetta SAILOR, RN Outcome: Progressing Goal: Ability to communicate needs accurately will improve 12/31/2023 1106 by Jenel Bobetta SAILOR, RN Outcome: Adequate for Discharge 12/31/2023 1036 by Jenel Bobetta SAILOR, RN Outcome: Progressing   Problem: Nutrition: Goal: Risk of aspiration will decrease 12/31/2023 1106 by Jenel Bobetta SAILOR, RN Outcome: Adequate for Discharge 12/31/2023 1036 by Jenel Bobetta SAILOR, RN Outcome: Progressing Goal: Dietary intake will improve 12/31/2023 1106 by Jenel Bobetta SAILOR, RN Outcome: Adequate for Discharge 12/31/2023 1036 by Jenel Bobetta SAILOR, RN Outcome: Progressing   Problem: Education: Goal: Knowledge of General Education information will improve Description: Including pain rating scale, medication(s)/side effects and non-pharmacologic comfort measures 12/31/2023 1106 by Jenel Bobetta SAILOR, RN Outcome: Adequate for Discharge 12/31/2023 1036 by Jenel Bobetta SAILOR, RN Outcome: Progressing   Problem: Health Behavior/Discharge Planning: Goal: Ability to manage health-related needs will improve 12/31/2023 1106 by Jenel Bobetta SAILOR, RN Outcome: Adequate for Discharge 12/31/2023 1036 by Jenel Bobetta SAILOR, RN Outcome: Progressing   Problem: Clinical  Measurements: Goal: Ability to maintain clinical measurements within normal limits will improve 12/31/2023 1106 by Jenel Bobetta SAILOR, RN Outcome: Adequate for Discharge 12/31/2023 1036 by Jenel Bobetta SAILOR, RN Outcome: Progressing Goal: Will remain free from infection 12/31/2023 1106 by Jenel Bobetta SAILOR, RN Outcome: Adequate for Discharge 12/31/2023 1036 by Jenel Bobetta SAILOR, RN Outcome: Progressing Goal: Diagnostic test results will improve 12/31/2023 1106 by Jenel Bobetta SAILOR, RN Outcome: Adequate for Discharge 12/31/2023 1036 by Jenel Bobetta SAILOR, RN Outcome: Progressing Goal: Respiratory complications will improve 12/31/2023 1106 by Jenel Bobetta SAILOR, RN Outcome: Adequate for Discharge 12/31/2023 1036 by Jenel Bobetta SAILOR, RN Outcome: Progressing Goal: Cardiovascular complication will be avoided 12/31/2023 1106 by Jenel Bobetta SAILOR, RN Outcome: Adequate for Discharge 12/31/2023 1036 by Jenel Bobetta SAILOR, RN Outcome: Progressing   Problem: Activity: Goal: Risk for activity intolerance will decrease 12/31/2023 1106 by Jenel Bobetta SAILOR, RN Outcome: Adequate for Discharge 12/31/2023 1036 by Jenel Bobetta SAILOR, RN Outcome: Progressing   Problem: Nutrition: Goal: Adequate nutrition will be maintained 12/31/2023 1106 by Jenel Bobetta SAILOR, RN Outcome: Adequate for Discharge 12/31/2023 1036 by Jenel Bobetta SAILOR, RN Outcome: Progressing   Problem: Coping: Goal: Level of anxiety will decrease 12/31/2023 1106 by Jenel Bobetta SAILOR, RN Outcome: Adequate for Discharge 12/31/2023 1036 by Jenel Bobetta SAILOR, RN Outcome: Progressing   Problem: Elimination: Goal: Will not experience complications related to bowel motility 12/31/2023 1106 by Jenel Bobetta SAILOR, RN Outcome: Adequate for Discharge 12/31/2023 1036 by Jenel Bobetta SAILOR, RN Outcome: Progressing Goal: Will not experience complications related to urinary retention 12/31/2023 1106 by Jenel Bobetta SAILOR, RN Outcome: Adequate for Discharge 12/31/2023 1036 by  Jenel Bobetta SAILOR, RN Outcome: Progressing   Problem: Pain Managment: Goal: General experience of comfort will improve and/or be controlled 12/31/2023 1106 by Jenel Bobetta SAILOR, RN Outcome: Adequate for Discharge 12/31/2023 1036 by Jenel Bobetta SAILOR, RN Outcome: Progressing   Problem: Safety: Goal: Ability to remain free from injury will improve 12/31/2023 1106 by Jenel Bobetta SAILOR, RN Outcome: Adequate for Discharge 12/31/2023 1036 by Jenel Bobetta SAILOR, RN  Outcome: Progressing   Problem: Skin Integrity: Goal: Risk for impaired skin integrity will decrease 12/31/2023 1106 by Jenel Bobetta SAILOR, RN Outcome: Adequate for Discharge 12/31/2023 1036 by Jenel Bobetta SAILOR, RN Outcome: Progressing

## 2023-12-31 NOTE — Discharge Summary (Signed)
 Physician Discharge Summary  Jonathan Stone FMW:[id number] DOB: 12/22/1955 DOA: 12/29/2023  PCP: Joyce Norleen BROCKS, MD  Admit date: 12/29/2023 Discharge date: 12/31/2023  Admitted From:  Discharge disposition: home   Recommendations for Outpatient Follow-Up:   Outpatient neurology follow-up Outpatient cardiology follow-up for help with blood pressure control and echo follow-up   Discharge Diagnosis:   Principal Problem:   Acute cerebrovascular accident (CVA) (HCC) Active Problems:   Hypertensive urgency   AKI (acute kidney injury)   Infarction of right basal ganglia (HCC)    Discharge Condition: Improved.  Diet recommendation: Low sodium, heart healthy.  Wound care: None.  Code status: Full.   History of Present Illness:   Mr. Jonathan Stone is a 68 y.o. male with history of HTN, HLD, obesity, who presents with left facial droop, left arm numbness and slurred speech.  NIH on Admission 2    Hospital Course by Problem:   Stroke:  right basal ganglia infarct, etiology:  small vessel disease given uncontrolled risk factors vs ESUS due to the size > 2.0cm   Code Stroke CT head - Focal hypodensity within the right basal ganglia and extending into the periventricular white matter, suspicious for acute infarct. CTA head & neck Normal CTA of the head and neck. Fetal type origin of the PCAs with overall diminutive vertebrobasilar system.   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 Associated mild petechial blood products 2D Echo EF 60-65%, There is severe concentric left ventricular hypertrophy.  LDL 85 HgbA1c 7.2 UDS neg VTE prophylaxis - lovenox aspirin 81 mg daily prior to admission, now on aspirin 81 mg daily and clopidogrel 75 mg daily for 3 weeks and then plavix alone.    Hypertension Resume home meds Long term BP goal normotensive   Hyperlipidemia Home meds: not compliant with crestor  LDL 85, goal <  70 Resume home Crestor    Diabetes type II Uncontrolled Resume home meds-goal of less than 7    Medical Consultants:   Neurology   Discharge Exam:   Vitals:   12/31/23 0515 12/31/23 0900  BP: (!) 145/80 (!) 154/68  Pulse: (!) 58 74  Resp: 17 16  Temp: 98.6 F (37 C) 98.4 F (36.9 C)  SpO2: 96% 97%   Vitals:   12/30/23 2055 12/31/23 0037 12/31/23 0515 12/31/23 0900  BP: (!) 167/71 (!) 156/69 (!) 145/80 (!) 154/68  Pulse: 65 (!) 55 (!) 58 74  Resp: 18 18 17 16   Temp: 98.8 F (37.1 C) 98.6 F (37 C) 98.6 F (37 C) 98.4 F (36.9 C)  TempSrc: Oral Oral Oral Oral  SpO2: 97% 95% 96% 97%  Weight:      Height:        General exam: Appears calm and comfortable.    The results of significant diagnostics from this hospitalization (including imaging, microbiology, ancillary and laboratory) are listed below for reference.     Procedures and Diagnostic Studies:   ECHOCARDIOGRAM COMPLETE Result Date: 12/30/2023    ECHOCARDIOGRAM REPORT   Patient Name:   Jonathan Stone Date of Exam: 12/30/2023 Medical Rec #:  [id number]     Height:       66.0 in Accession #:    [id number]    Weight:       180.0 lb Date of Birth:  [birth year]     BSA:          1.912 m Patient Age:    69  years      BP:           142/64 mmHg Patient Gender: M             HR:           68 bpm. Exam Location:  Inpatient Procedure: 2D Echo, Cardiac Doppler, Color Doppler and Intracardiac            Opacification Agent (Both Spectral and Color Flow Doppler were            utilized during procedure). Indications:    Stroke 163.9  History:        Patient has prior history of Echocardiogram examinations, most                 recent 05/29/2008. Stroke; Risk Factors:Hypertension, Diabetes                 and Dyslipidemia.  Sonographer:    Merlynn Argyle Referring Phys: [id number] [facility] IMPRESSIONS  1. Left ventricular ejection fraction, by estimation, is 60 to 65%. The left ventricle has normal function. The left ventricle  demonstrates regional wall motion abnormalities (see scoring diagram/findings for description). There is severe concentric left ventricular hypertrophy. Left ventricular diastolic parameters are consistent with Grade I diastolic dysfunction (impaired relaxation).  2. Right ventricular systolic function is normal. The right ventricular size is normal. Tricuspid regurgitation signal is inadequate for assessing PA pressure.  3. The mitral valve is normal in structure. Trivial mitral valve regurgitation. No evidence of mitral stenosis.  4. The aortic valve is tricuspid. Aortic valve regurgitation is not visualized. No aortic stenosis is present.  5. The inferior vena cava is normal in size with greater than 50% respiratory variability, suggesting right atrial pressure of 3 mmHg.  6. Cannot exclude a small PFO. Comparison(s): No prior Echocardiogram. Conclusion(s)/Recommendation(s): Cannot exclude ASD/PFO. Consider transesophageal echocardiogram, if clinically indicated. Consider infiltrative cardiomyopathy if no other cause of severe left ventricular hypertrophy. FINDINGS  Left Ventricle: Left ventricular ejection fraction, by estimation, is 60 to 65%. The left ventricle has normal function. The left ventricle demonstrates regional wall motion abnormalities. Definity contrast agent was given IV to delineate the left ventricular endocardial borders. The left ventricular internal cavity size was normal in size. There is severe concentric left ventricular hypertrophy. Left ventricular diastolic parameters are consistent with Grade I diastolic dysfunction (impaired relaxation).  LV Wall Scoring: The apex is hypokinetic. Right Ventricle: The right ventricular size is normal. No increase in right ventricular wall thickness. Right ventricular systolic function is normal. Tricuspid regurgitation signal is inadequate for assessing PA pressure. Left Atrium: Left atrial size was normal in size. Right Atrium: Right atrial size was  normal in size. Pericardium: Trivial pericardial effusion is present. The pericardial effusion is posterior to the left ventricle. Mitral Valve: The mitral valve is normal in structure. Trivial mitral valve regurgitation. No evidence of mitral valve stenosis. Tricuspid Valve: The tricuspid valve is normal in structure. Tricuspid valve regurgitation is not demonstrated. No evidence of tricuspid stenosis. Aortic Valve: The aortic valve is tricuspid. Aortic valve regurgitation is not visualized. No aortic stenosis is present. Pulmonic Valve: The pulmonic valve was normal in structure. Pulmonic valve regurgitation is not visualized. No evidence of pulmonic stenosis. Aorta: The aortic root and ascending aorta are structurally normal, with no evidence of dilitation. Venous: The inferior vena cava is normal in size with greater than 50% respiratory variability, suggesting right atrial pressure of 3 mmHg. IAS/Shunts: Cannot exclude a small PFO.  LEFT VENTRICLE PLAX 2D LVIDd:         4.50 cm   Diastology LVIDs:         2.90 cm   LV e' medial:    5.78 cm/s LV PW:         1.40 cm   LV E/e' medial:  13.1 LV IVS:        1.40 cm   LV e' lateral:   6.20 cm/s LVOT diam:     2.00 cm   LV E/e' lateral: 12.2 LV SV:         49 LV SV Index:   26 LVOT Area:     3.14 cm LV IVRT:       102 msec  RIGHT VENTRICLE             IVC RV Basal diam:  3.00 cm     IVC diam: 1.10 cm RV S prime:     12.60 cm/s TAPSE (M-mode): 2.1 cm      PULMONARY VEINS                             Diastolic Velocity: 26.40 cm/s                             S/D Velocity:       1.30                             Systolic Velocity:  35.00 cm/s LEFT ATRIUM             Index        RIGHT ATRIUM           Index LA diam:        4.30 cm 2.25 cm/m   RA Area:     14.10 cm LA Vol (A2C):   70.2 ml 36.71 ml/m  RA Volume:   34.70 ml  18.15 ml/m LA Vol (A4C):   55.0 ml 28.76 ml/m LA Biplane Vol: 60.7 ml 31.74 ml/m  AORTIC VALVE LVOT Vmax:   85.70 cm/s LVOT Vmean:  54.800 cm/s  LVOT VTI:    0.156 m  AORTA Ao Root diam: 3.40 cm Ao Asc diam:  3.40 cm MITRAL VALVE MV Area (PHT): 3.37 cm    SHUNTS MV Decel Time: 225 msec    Systemic VTI:  0.16 m MV E velocity: 75.80 cm/s  Systemic Diam: 2.00 cm MV A velocity: 71.30 cm/s MV E/A ratio:  1.06 Emeline Calender Electronically signed by Emeline Calender Signature Date/Time: 12/30/2023/4:36:27 PM    Final    CT ANGIO HEAD NECK W WO CM Result Date: 12/30/2023 CLINICAL DATA:  Initial evaluation for acute neuro deficit, stroke. EXAM: CT ANGIOGRAPHY HEAD AND NECK WITH AND WITHOUT CONTRAST TECHNIQUE: Multidetector CT imaging of the head and neck was performed using the standard protocol during bolus administration of intravenous contrast. Multiplanar CT image reconstructions and MIPs were obtained to evaluate the vascular anatomy. Carotid stenosis measurements (when applicable) are obtained utilizing NASCET criteria, using the distal internal carotid diameter as the denominator. RADIATION DOSE REDUCTION: This exam was performed according to the departmental dose-optimization program which includes automated exposure control, adjustment of the mA and/or kV according to patient size and/or use of iterative reconstruction technique. CONTRAST:  75mL OMNIPAQUE IOHEXOL 350 MG/ML SOLN COMPARISON:  Comparison made with CT  of MRI from earlier the same day. FINDINGS: CTA NECK FINDINGS Aortic arch: Standard branching. Imaged portion shows no evidence of aneurysm or dissection. No significant stenosis of the major arch vessel origins. Right carotid system: No evidence of dissection, stenosis (50% or greater), or occlusion. Left carotid system: No evidence of dissection, stenosis (50% or greater), or occlusion. Vertebral arteries: Both vertebral arteries arise from subclavian arteries. Left vertebral artery partially obscured due to adjacent venous contamination. Visualized vertebral arteries patent without stenosis or dissection. Skeleton: No worrisome osseous lesions.  Mild-to-moderate spondylosis at C5-6 and C6-7. Other neck: No other acute finding. Upper chest: No other acute finding. Review of the MIP images confirms the above findings CTA HEAD FINDINGS Anterior circulation: Both internal carotid arteries widely patent to the termini without stenosis. A1 segments widely patent. Normal anterior communicating artery complex. Both anterior cerebral arteries widely patent to their distal aspects without stenosis. No M1 stenosis or occlusion. Normal MCA bifurcations. Distal MCA branches well perfused and symmetric. Posterior circulation: Both V4 segments are diminutive but patent without stenosis. Left vertebral artery slightly dominant. Both PICA patent at their origins. Basilar markedly diminutive but patent without stenosis. Superior cerebral arteries patent bilaterally. Fetal type origin of the PCAs. PCAs patent to their distal aspects without stenosis. Venous sinuses: Grossly patent allowing for timing the contrast bolus. Anatomic variants: Fetal type origin of the PCAs with overall diminutive vertebrobasilar system. No aneurysm. Review of the MIP images confirms the above findings IMPRESSION: 1. Normal CTA of the head and neck. No large vessel occlusion or other emergent finding. No hemodynamically significant or correctable stenosis. 2. Fetal type origin of the PCAs with overall diminutive vertebrobasilar system. Electronically Signed   By: Morene Hoard M.D.   On: 12/30/2023 00:07   MR BRAIN WO CONTRAST Result Date: 12/29/2023 CLINICAL DATA:  Initial evaluation for acute neuro deficit, stroke suspected. EXAM: MRI HEAD WITHOUT CONTRAST TECHNIQUE: Multiplanar, multiecho pulse sequences of the brain and surrounding structures were obtained without intravenous contrast. COMPARISON:  CT from earlier the same day. FINDINGS: Brain: Cerebral volume within normal limits. Mild patchy T2/FLAIR signal abnormality seen involving the periventricular and deep white matter,  consistent with chronic small vessel ischemic disease, mild in nature. 2.6 cm focus of restricted diffusion seen involving the right basal ganglia, consistent with an acute to early subacute perforator type infarct (series 7, image 60). Associated mild petechial blood products without hemorrhagic transformation or significant regional mass effect. No other evidence for acute or subacute ischemia. Gray-white matter differentiation otherwise maintained. No areas of chronic cortical infarction. No other acute or chronic intracranial blood products. No mass lesion, midline shift or mass effect no hydrocephalus or extra-axial fluid collection. Pituitary gland within normal limits. Vascular: Major intracranial vascular flow voids are maintained. Skull and upper cervical spine: Craniocervical junction within normal limits. Decreased T1 signal intensity within the bone marrow of the upper cervical spine, nonspecific, but most commonly related to anemia, smoking or obesity. No scalp soft tissue abnormality. Sinuses/Orbits: Globes and orbital soft tissues within normal limits. Mild scattered mucosal thickening present about the sphenoid ethmoidal sinuses. Paranasal sinuses are otherwise clear. No mastoid effusion. Other: None. IMPRESSION: 1. 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 Associated mild petechial blood products without hemorrhagic transformation or significant regional mass effect. 2. Underlying mild chronic microvascular ischemic disease. Electronically Signed   By: Morene Hoard M.D.   On: 12/29/2023 21:22   CT HEAD WO CONTRAST Result Date:  12/29/2023 CLINICAL DATA:  Left-sided facial numbness and left arm weakness per epic notes EXAM: CT HEAD WITHOUT CONTRAST TECHNIQUE: Contiguous axial images were obtained from the base of the skull through the vertex without intravenous contrast. RADIATION DOSE REDUCTION: This exam was performed according to the  departmental dose-optimization program which includes automated exposure control, adjustment of the mA and/or kV according to patient size and/or use of iterative reconstruction technique. COMPARISON:  None Available. FINDINGS: Brain: No hemorrhage or intracranial mass. Focal hypodensity within the right basal ganglia and extending into the periventricular white matter. No midline shift or significant mass effect. Mild atrophy. Patchy white matter hypodensities suggestive of mild chronic small vessel disease. Nonenlarged ventricles Vascular: No hyperdense vessels.  No unexpected calcification Skull: Normal. Negative for fracture or focal lesion. Sinuses/Orbits: No acute finding. Other: None IMPRESSION: 1. Focal hypodensity within the right basal ganglia and extending into the periventricular white matter, suspicious for acute infarct. No hemorrhage or mass effect. 2. Mild atrophy and chronic small vessel disease. Electronically Signed   By: Luke Bun M.D.   On: 12/29/2023 15:23     Labs:   Basic Metabolic Panel: Recent Labs  Lab 12/29/23 1409 12/29/23 1427 12/30/23 0411 12/31/23 0527  NA 140 140 138 140  K 4.5 4.8 3.8 3.9  CL 100 102 106 104  CO2 31  --  23 26  GLUCOSE 171* 165* 103* 105*  BUN 13 16 11 17   CREATININE 1.49* 1.50* 1.25* 1.34*  CALCIUM  9.8  --  8.7* 8.9   GFR Estimated Creatinine Clearance: 52.9 mL/min (A) (by C-G formula based on SCr of 1.34 mg/dL (H)). Liver Function Tests: Recent Labs  Lab 12/29/23 1409  AST 26  ALT 19  ALKPHOS 83  BILITOT 1.1  PROT 7.4  ALBUMIN 4.1   No results for input(s): LIPASE, AMYLASE in the last 168 hours. No results for input(s): AMMONIA in the last 168 hours. Coagulation profile Recent Labs  Lab 12/29/23 1409  INR 1.0    CBC: Recent Labs  Lab 12/29/23 1409 12/29/23 1427 12/30/23 0411 12/31/23 0527  WBC 11.6*  --  9.9 7.9  NEUTROABS 6.2  --   --   --   HGB 16.9 17.7* 15.4 15.5  HCT 51.1 52.0 47.6 46.7  MCV  82.8  --  84.1 82.4  PLT 188  --  144* 132*   Cardiac Enzymes: No results for input(s): CKTOTAL, CKMB, CKMBINDEX, TROPONINI in the last 168 hours. BNP: Invalid input(s): POCBNP CBG: Recent Labs  Lab 12/30/23 0740 12/30/23 1134 12/30/23 1628 12/30/23 2210 12/31/23 0624  GLUCAP 102* 108* 103* 152* 111*   D-Dimer No results for input(s): DDIMER in the last 72 hours. Hgb A1c Recent Labs    12/30/23 0410  HGBA1C 7.2*   Lipid Profile Recent Labs    12/30/23 0411  CHOL 141  HDL 36*  LDLCALC 85  TRIG 98  CHOLHDL 3.9   Thyroid function studies No results for input(s): TSH, T4TOTAL, T3FREE, THYROIDAB in the last 72 hours.  Invalid input(s): FREET3 Anemia work up No results for input(s): VITAMINB12, FOLATE, FERRITIN, TIBC, IRON, RETICCTPCT in the last 72 hours. Microbiology No results found for this or any previous visit (from the past 240 hours).   Discharge Instructions:   Discharge Instructions     Ambulatory referral to Cardiology   Complete by: As directed    Ambulatory referral to Neurology   Complete by: As directed    An appointment is requested in approximately:  4 weeks   Diet - low sodium heart healthy   Complete by: As directed    Diet Carb Modified   Complete by: As directed    Discharge instructions   Complete by: As directed    aspirin 81 mg daily and clopidogrel 75 mg daily for 3 weeks and then plavix alone.   Increase activity slowly   Complete by: As directed       Allergies as of 12/31/2023       Reactions   Porcine (pork) Protein-containing Drug Products    DOES NOT EAT PORK        Medication List     PAUSE taking these medications    lisinopril  10 MG tablet Wait to take this until your doctor or other care provider tells you to start again. Commonly known as: ZESTRIL  Take 10 mg by mouth daily at 8 pm.       TAKE these medications    amLODipine  10 MG tablet Commonly known as:  NORVASC  Take 1 tablet (10 mg total) by mouth daily. What changed:  when to take this additional instructions   aspirin EC 81 MG tablet Take 1 tablet (81 mg total) by mouth daily. Swallow whole. Start taking on: January 01, 2024 What changed:  when to take this additional instructions   clopidogrel 75 MG tablet Commonly known as: PLAVIX Take 1 tablet (75 mg total) by mouth daily. Start taking on: January 01, 2024   empagliflozin  25 MG Tabs tablet Commonly known as: JARDIANCE  Take 1 tablet (25 mg total) by mouth daily. What changed:  when to take this additional instructions   LIBREXIA-STROKE milvexian or placebo 25 mg tablet Take 1 tablet by mouth 2 (two) times daily. For Investigational Use Only. Bring all bottles back to next study visit. Contact [facility] with questions regarding this medication.   metFORMIN  850 MG tablet Commonly known as: GLUCOPHAGE  Take 1 tablet (850 mg total) by mouth 2 (two) times daily with a meal. What changed:  when to take this additional instructions   metoprolol  succinate 100 MG 24 hr tablet Commonly known as: TOPROL -XL Take 0.5 tablets (50 mg total) by mouth daily at 8 pm. Take one tablet (100mg ) by mouth every evening at 2000. What changed: See the new instructions.   rosuvastatin  20 MG tablet Commonly known as: Crestor  Take 1 tablet (20 mg total) by mouth daily.          Time coordinating discharge: 45 min  Signed:  Harlene RAYMOND Bowl DO  [facility] 12/31/2023, 11:00 AM

## 2023-12-31 NOTE — Plan of Care (Signed)
   Problem: Education: Goal: Ability to describe self-care measures that may prevent or decrease complications (Diabetes Survival Skills Education) will improve Outcome: Progressing

## 2023-12-31 NOTE — Evaluation (Signed)
 Physical Therapy Brief Evaluation and Discharge Note Patient Details Name: Jonathan Stone MRN: [id number] DOB: [birth year] Today's Date: 12/31/2023   History of Present Illness  Patient is a 68 yo male presenting to the ED with L sided facial droop, L sided tingling, slurred speech on 12/29/23. 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 CTA of head and neck clear. PMH includes: HTN, HLD, obesity  Clinical Impression  Patient evaluated by Physical Therapy with no further acute PT needs identified. Pt reports he feels back to his functional baseline. Pt ambulating 400 ft with no AD independently. Scoring 24/24 on the DGI, indicating he is not at high risk for falls. Education provided regarding AHA recommendations for cardiovascular activity. All education has been completed and the patient has no further questions. No follow-up Physical Therapy or equipment needs. PT is signing off. Thank you for this referral.      PT Assessment Patient does not need any further PT services  Assistance Needed at Discharge  None    Equipment Recommendations None recommended by PT  Recommendations for Other Services       Precautions/Restrictions Precautions Precautions: None Restrictions Weight Bearing Restrictions Per Provider Order: No        Mobility  Bed Mobility   Supine/Sidelying to sit: Independent      Transfers Overall transfer level: Independent Equipment used: None                    Ambulation/Gait Ambulation/Gait assistance: Independent Gait Distance (Feet): 400 Feet Assistive device: None Gait Pattern/deviations: WFL(Within Functional Limits)      Home Activity Instructions    Stairs            Modified Rankin (Stroke Patients Only) Modified Rankin (Stroke Patients Only) Pre-Morbid Rankin Score: No symptoms Modified Rankin: No symptoms      Balance Overall balance assessment: No apparent  balance deficits (not formally assessed)               Standardized Balance Assessment Standardized Balance Assessment : Dynamic Gait Index Dynamic Gait Index Level Surface: Normal Change in Gait Speed: Normal Gait with Horizontal Head Turns: Normal Gait with Vertical Head Turns: Normal Gait and Pivot Turn: Normal Step Over Obstacle: Normal Step Around Obstacles: Normal Steps: Normal Total Score: 24      Pertinent Vitals/Pain PT - Brief Vital Signs All Vital Signs Stable: Yes Pain Assessment Pain Assessment: Faces Faces Pain Scale: No hurt     Home Living Family/patient expects to be discharged to:: Private residence Living Arrangements: Spouse/significant other Available Help at Discharge: Family Home Environment: Stairs to enter  Landscape Architect of Steps: 5 Home Equipment: None        Prior Function Level of Independence: Independent Comments: Works as a Architectural Technologist   UE ROM/Strength/Tone/Coordination: [employer]    LE ROM/Strength/Tone/Coordination: [employer]      Communication   Communication Communication: No apparent difficulties     Cognition Overall Cognitive Status: Appears within functional limits for tasks assessed/performed       General Comments      Exercises     Assessment/Plan    PT Problem List         PT Visit Diagnosis Other symptoms and signs involving the nervous system (R29.898)    No Skilled PT All education completed;Patient at baseline level of functioning;Patient is independent with all acitivity/mobility   Co-evaluation  AMPAC 6 Clicks Help needed turning from your back to your side while in a flat bed without using bedrails?: None Help needed moving from lying on your back to sitting on the side of a flat bed without using bedrails?: None Help needed moving to and from a bed to a chair (including a wheelchair)?: None Help needed standing up from a chair using your arms (e.g.,  wheelchair or bedside chair)?: None Help needed to walk in hospital room?: None Help needed climbing 3-5 steps with a railing? : None 6 Click Score: 24      End of Session   Activity Tolerance: Patient tolerated treatment well Patient left: in bed;with call bell/phone within reach Nurse Communication: Mobility status PT Visit Diagnosis: Other symptoms and signs involving the nervous system (R29.898)     Time: 9041-8991 PT Time Calculation (min) (ACUTE ONLY): 10 min  Charges:   PT Evaluation $PT Eval Low Complexity: 1 Low      Aleck Daring, PT, DPT Acute Rehabilitation Services Office [phone]   Aleck ONEIDA Daring  12/31/2023, 10:12 AM

## 2023-12-31 NOTE — Plan of Care (Signed)
  Problem: Fluid Volume: Goal: Ability to maintain a balanced intake and output will improve Outcome: Progressing   Problem: Health Behavior/Discharge Planning: Goal: Ability to manage health-related needs will improve Outcome: Progressing   Problem: Nutritional: Goal: Maintenance of adequate nutrition will improve Outcome: Progressing   Problem: Ischemic Stroke/TIA Tissue Perfusion: Goal: Complications of ischemic stroke/TIA will be minimized Outcome: Progressing   Problem: Clinical Measurements: Goal: Will remain free from infection Outcome: Progressing

## 2024-01-02 ENCOUNTER — Encounter: Payer: Self-pay | Admitting: Family Medicine

## 2024-01-02 ENCOUNTER — Telehealth: Payer: Self-pay

## 2024-01-02 ENCOUNTER — Ambulatory Visit (INDEPENDENT_AMBULATORY_CARE_PROVIDER_SITE_OTHER): Admitting: Family Medicine

## 2024-01-02 VITALS — BP 134/74 | HR 65 | Wt 183.2 lb

## 2024-01-02 DIAGNOSIS — I10 Essential (primary) hypertension: Secondary | ICD-10-CM

## 2024-01-02 DIAGNOSIS — E1165 Type 2 diabetes mellitus with hyperglycemia: Secondary | ICD-10-CM | POA: Diagnosis not present

## 2024-01-02 DIAGNOSIS — Z09 Encounter for follow-up examination after completed treatment for conditions other than malignant neoplasm: Secondary | ICD-10-CM | POA: Diagnosis not present

## 2024-01-02 MED ORDER — AMLODIPINE BESYLATE 10 MG PO TABS: 10.0000 mg | ORAL_TABLET | Freq: Every day | ORAL | 3 refills | Status: DC

## 2024-01-02 MED ORDER — LISINOPRIL 10 MG PO TABS: 10.0000 mg | ORAL_TABLET | Freq: Every day | ORAL | 1 refills | Status: DC

## 2024-01-02 MED ORDER — EMPAGLIFLOZIN 25 MG PO TABS: 25.0000 mg | ORAL_TABLET | Freq: Every day | ORAL | 3 refills | Status: DC

## 2024-01-02 NOTE — Progress Notes (Signed)
 Subjective:    Jonathan Stone is seen for Transitional Care Management face-to-face visit following discharge from 12/31/2023.  Please see recent Care Manager telephone encounter dated 11/17. Discharge summary obtained and reviewed. Admit date was 12/29/2023. Discharge date was 12/31/2023.  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  Current status is as follows: Stable, no residual deficits.   Other medical problems addressed this visit:  HTN Diabetes  HPI: Jonathan Stone is a 68 year old male who presents for follow-up after a recent hospital discharge for a stroke.  He was hospitalized from Thursday to Saturday due to a stroke. Since the episode, he feels good with no residual symptoms. He is scheduled to visit a stroke clinic on December 16th.  He is currently taking several medications including lisinopril  for blood pressure, metoprolol , aspirin, Farxiga , and Jardiance  for diabetes. The metoprolol  dosage was reduced by half. He has a blood pressure machine at home and has been monitoring his blood pressure.   Current Outpatient Medications  Medication Sig Dispense Refill   aspirin EC 81 MG tablet Take 1 tablet (81 mg total) by mouth daily. Swallow whole. 30 tablet 12   clopidogrel (PLAVIX) 75 MG tablet Take 1 tablet (75 mg total) by mouth daily. 30 tablet 1   metFORMIN  (GLUCOPHAGE ) 850 MG tablet Take 1 tablet (850 mg total) by mouth 2 (two) times daily with a meal. (Patient taking differently: Take 850 mg by mouth daily after supper. Take one tablet (850mg ) by mouth daily after supper.) 180 tablet 1   metoprolol  succinate (TOPROL -XL) 100 MG 24 hr tablet Take 1/2 tablet by mouth daily 30 tablet 0   rosuvastatin  (CRESTOR ) 20 MG tablet Take 1 tablet (20 mg total) by mouth daily. 90 tablet 0   Study - LIBREXIA-STROKE - milvexian 25 mg or placebo tablet (PI-Sethi) Take 1 tablet by mouth 2 (two) times daily. For Investigational Use Only. Bring all bottles back  to next study visit. Contact [facility] with questions regarding this medication. 70 tablet 0   amLODipine  (NORVASC ) 10 MG tablet Take 1 tablet (10 mg total) by mouth daily. 90 tablet 3   empagliflozin  (JARDIANCE ) 25 MG TABS tablet Take 1 tablet (25 mg total) by mouth daily. 90 tablet 3   lisinopril  (ZESTRIL ) 10 MG tablet Take 1 tablet (10 mg total) by mouth daily at 8 pm. 90 tablet 1   No current facility-administered medications for this visit.    Past Medical History:  Diagnosis Date   Dyslipidemia    Hyperlipidemia LDL goal < 100 01/29/2011   Hyperlipidemia with target LDL less than 100 01/29/2011   Hypertension    Obesity     Past Surgical History:  Procedure Laterality Date   COLONOSCOPY  2010   Dr.perry   POLYPECTOMY          Objective:    BP 134/74   Pulse 65   Wt 183 lb 3.2 oz (83.1 kg)   SpO2 98%   BMI 29.57 kg/m  Physical Exam Constitutional:      Appearance: Normal appearance.  Neurological:     General: No focal deficit present.     Mental Status: He is alert and oriented to person, place, and time. Mental status is at baseline.          Assessment & Plan:   1. Medications/DME - Continue current medications. 2. Lab/Diagnostics - See orders. None 3. Education - Patient and Current treatment discussed and patient education given  as appropriate. 4. Referrals - None  Return in about 2 weeks (around 01/16/2024).   Right Basal ganglia infarct -Stable, continue DAPT. F/u with stroke rehab.  Essential hypertension Blood pressure controlled with lisinopril  and metoprolol . Amlodipine  held due to hypotension risk. - Continue amlodipine . - Monitor blood pressure at home. - If blood pressure exceeds 140/90 mmHg, take amlodipine . - Continue lisinopril  and metoprolol . - Follow up in two weeks to reassess blood pressure.  Type 2 diabetes mellitus Diabetes management includes Jardiance . - Continue Jardiance .

## 2024-01-02 NOTE — Transitions of Care (Post Inpatient/ED Visit) (Signed)
   01/02/2024  Name: Jonathan Stone MRN: [id number] DOB: 20-Oct-1955  Today's TOC FU Call Status: Today's TOC FU Call Status:: Unsuccessful Call (1st Attempt) Unsuccessful Call (1st Attempt) Date: 01/02/24  Attempted to reach the patient regarding the most recent Inpatient/ED visit.  Follow Up Plan: Additional outreach attempts will be made to reach the patient to complete the Transitions of Care (Post Inpatient/ED visit) call.   Medford Balboa, BSN, RN [place]  VBCI - [employer] [facility] RN Care Manager [phone]

## 2024-01-03 ENCOUNTER — Telehealth: Payer: Self-pay

## 2024-01-03 ENCOUNTER — Telehealth: Payer: Self-pay | Admitting: *Deleted

## 2024-01-03 NOTE — Telephone Encounter (Signed)
 Copied from CRM [id number]. Topic: General - Other >> Jan 03, 2024 11:31 AM Deaijah H wrote: Reason for CRM: [place] nurse w/ [employer] called in to ask a few questions regarding his short term dis. That he filed for. Need certain dates [phone] or prefer to fax admission notes [phone]   Faxed all information.

## 2024-01-03 NOTE — Transitions of Care (Post Inpatient/ED Visit) (Signed)
   01/03/2024  Name: Nikos Anglemyer MRN: [id number] DOB: Dec 26, 1955  Today's TOC FU Call Status: Today's TOC FU Call Status:: Unsuccessful Call (2nd Attempt) Unsuccessful Call (1st Attempt) Date: 01/02/24 Unsuccessful Call (2nd Attempt) Date: 01/03/24  Attempted to reach the patient regarding the most recent Inpatient/ED visit.  Follow Up Plan: Additional outreach attempts will be made to reach the patient to complete the Transitions of Care (Post Inpatient/ED visit) call.   Medford Balboa, BSN, RN [place]  VBCI - [employer] [facility] RN Care Manager [phone]

## 2024-01-04 ENCOUNTER — Telehealth: Payer: Self-pay

## 2024-01-04 NOTE — Transitions of Care (Post Inpatient/ED Visit) (Signed)
   01/04/2024  Name: Nevaan Bunton MRN: [id number] DOB: August 06, 1955  Today's TOC FU Call Status: Today's TOC FU Call Status:: Unsuccessful Call (3rd Attempt) Unsuccessful Call (1st Attempt) Date: 01/02/24 Unsuccessful Call (2nd Attempt) Date: 01/03/24 Unsuccessful Call (3rd Attempt) Date: 01/04/24  Attempted to reach the patient regarding the most recent Inpatient/ED visit.  Follow Up Plan: No further outreach attempts will be made at this time. We have been unable to contact the patient.  Medford Balboa, BSN, RN [place]  VBCI - [employer] [facility] RN Care Manager [phone]

## 2024-01-17 ENCOUNTER — Ambulatory Visit: Admitting: Family Medicine

## 2024-01-17 ENCOUNTER — Telehealth: Payer: Self-pay | Admitting: Family Medicine

## 2024-01-17 VITALS — BP 154/88 | HR 64 | Wt 186.8 lb

## 2024-01-17 DIAGNOSIS — E1165 Type 2 diabetes mellitus with hyperglycemia: Secondary | ICD-10-CM | POA: Diagnosis not present

## 2024-01-17 DIAGNOSIS — Z794 Long term (current) use of insulin: Secondary | ICD-10-CM

## 2024-01-17 DIAGNOSIS — I152 Hypertension secondary to endocrine disorders: Secondary | ICD-10-CM

## 2024-01-17 DIAGNOSIS — Z09 Encounter for follow-up examination after completed treatment for conditions other than malignant neoplasm: Secondary | ICD-10-CM

## 2024-01-17 DIAGNOSIS — E1159 Type 2 diabetes mellitus with other circulatory complications: Secondary | ICD-10-CM

## 2024-01-17 MED ORDER — METOPROLOL SUCCINATE ER 100 MG PO TB24: 50.0000 mg | ORAL_TABLET | Freq: Every day | ORAL | 0 refills | Status: DC

## 2024-01-17 MED ORDER — METOPROLOL SUCCINATE ER 100 MG PO TB24: 50.0000 mg | ORAL_TABLET | Freq: Every day | ORAL | 1 refills | Status: DC

## 2024-01-17 MED ORDER — LISINOPRIL 30 MG PO TABS: 30.0000 mg | ORAL_TABLET | Freq: Every day | ORAL | 1 refills | Status: DC

## 2024-01-17 NOTE — Progress Notes (Signed)
   Name: Jonathan Stone   Date of Visit: 01/17/24   Date of last visit with me: 01/02/2024   CHIEF COMPLAINT:  Chief Complaint  Patient presents with   Follow-up    2 week follow up on bp.       HPI:  Discussed the use of AI scribe software for clinical note transcription with the patient, who gave verbal consent to proceed.  History of Present Illness   Jonathan Stone is a 68 year old male with hypertension who presents for blood pressure management.  He has experienced elevated blood pressure, which he attributes to family stressors, including issues with his daughter's boyfriend. He feels unwell and unable to engage in usual activities.  He is currently taking metoprolol  and lisinopril  for blood pressure management. He is concerned about the impact of stress on his blood pressure.  He has not had a physical examination this year.         OBJECTIVE:       11/08/2023    8:07 AM  Depression screen PHQ 2/9  Decreased Interest 0  Down, Depressed, Hopeless 0  PHQ - 2 Score 0     BP Readings from Last 3 Encounters:  01/17/24 (!) 154/88  01/02/24 134/74  12/31/23 (!) 154/68    BP (!) 154/88   Pulse 64   Wt 186 lb 12.8 oz (84.7 kg)   SpO2 97%   BMI 30.15 kg/m    Physical Exam          Physical Exam Constitutional:      Appearance: Normal appearance.  Neurological:     General: No focal deficit present.     Mental Status: He is alert and oriented to person, place, and time. Mental status is at baseline.     ASSESSMENT/PLAN:   Assessment & Plan Hypertension associated with diabetes (HCC)  Type 2 diabetes mellitus with hyperglycemia, without long-term current use of insulin  [facility])  Hospital discharge follow-up    Assessment and Plan    Hypertension Blood pressure elevated, likely stress-related. Current regimen includes lisinopril  and metoprolol . Increased lisinopril  for better control and renal protection. - Increased lisinopril  to 30  mg daily. - Instructed to monitor blood pressure at home. - Scheduled follow-up in 2-3 months for reassessment and lab tests.  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 - Continue lisinopril  for renal protection. - Perform lab tests in 3 months to monitor renal function.         Jonathan Stone A. Vita MD [facility]

## 2024-01-17 NOTE — Telephone Encounter (Signed)
 Pt brought in short term disability forms to be completed  Asked patient about forms, he states he was in the hospital 11/13-11/17 due to stroke and has not returned to work He said work requires him to be out for 1 year for a stroke  Forms sent back in folder , return to [employer]

## 2024-01-18 ENCOUNTER — Telehealth: Payer: Self-pay

## 2024-01-18 DIAGNOSIS — Z09 Encounter for follow-up examination after completed treatment for conditions other than malignant neoplasm: Secondary | ICD-10-CM

## 2024-01-18 DIAGNOSIS — E1165 Type 2 diabetes mellitus with hyperglycemia: Secondary | ICD-10-CM

## 2024-01-18 DIAGNOSIS — E1159 Type 2 diabetes mellitus with other circulatory complications: Secondary | ICD-10-CM

## 2024-01-18 DIAGNOSIS — I10 Essential (primary) hypertension: Secondary | ICD-10-CM

## 2024-01-18 MED ORDER — EMPAGLIFLOZIN 25 MG PO TABS: 25.0000 mg | ORAL_TABLET | Freq: Every day | ORAL | 3 refills | Status: DC

## 2024-01-18 MED ORDER — METOPROLOL SUCCINATE ER 100 MG PO TB24: 50.0000 mg | ORAL_TABLET | Freq: Every day | ORAL | 1 refills | Status: DC

## 2024-01-18 MED ORDER — AMLODIPINE BESYLATE 10 MG PO TABS: 10.0000 mg | ORAL_TABLET | Freq: Every day | ORAL | 3 refills | Status: DC

## 2024-01-18 MED ORDER — EMPAGLIFLOZIN 25 MG PO TABS: 25.0000 mg | ORAL_TABLET | Freq: Every day | ORAL | 3 refills | Status: AC

## 2024-01-18 MED ORDER — LISINOPRIL 30 MG PO TABS: 30.0000 mg | ORAL_TABLET | Freq: Every day | ORAL | 1 refills | Status: DC

## 2024-01-18 NOTE — Telephone Encounter (Signed)
 Copied from CRM #[id number]. Topic: Clinical - Prescription Issue >> Jan 18, 2024 11:37 AM Donna BRAVO wrote: Reason for CRM:  Patient would like all medication to be sent to [facility] due to insurance and copay   [facility] not accepting patient insurance for  lisinopril  (ZESTRIL ) 30 MG tablet metoprolol  succinate (TOPROL -XL) 100 MG 24 hr tablet amLODipine  (NORVASC ) 10 MG tablet empagliflozin  (JARDIANCE ) 25 MG TABS tablet   Please send prescription to [id number] - [place]), [place] - [address] [place]) [place] [address] Phone: [phone] Fax: [phone] Hours: Not open 24 hours  Patient would like a call when medication has been sent, patient has new medication

## 2024-01-19 NOTE — Telephone Encounter (Signed)
 Called pharmacy and ordered medications

## 2024-01-25 ENCOUNTER — Ambulatory Visit: Attending: Cardiology | Admitting: Cardiology

## 2024-01-25 ENCOUNTER — Encounter: Payer: Self-pay | Admitting: Cardiology

## 2024-01-25 VITALS — BP 171/79 | HR 70 | Resp 16 | Ht 66.0 in | Wt 181.0 lb

## 2024-01-25 DIAGNOSIS — I10 Essential (primary) hypertension: Secondary | ICD-10-CM

## 2024-01-25 DIAGNOSIS — I160 Hypertensive urgency: Secondary | ICD-10-CM

## 2024-01-25 DIAGNOSIS — E782 Mixed hyperlipidemia: Secondary | ICD-10-CM

## 2024-01-25 DIAGNOSIS — R9431 Abnormal electrocardiogram [ECG] [EKG]: Secondary | ICD-10-CM

## 2024-01-25 DIAGNOSIS — Z79899 Other long term (current) drug therapy: Secondary | ICD-10-CM

## 2024-01-25 DIAGNOSIS — Z8673 Personal history of transient ischemic attack (TIA), and cerebral infarction without residual deficits: Secondary | ICD-10-CM

## 2024-01-25 MED ORDER — CHLORTHALIDONE 25 MG PO TABS: 25.0000 mg | ORAL_TABLET | Freq: Every day | ORAL | 3 refills | Status: DC

## 2024-01-25 NOTE — Patient Instructions (Addendum)
 Medication Instructions:   START chlorthalidone  25mg  once daily in the MORNING   MOVE amlodipine  to EVENING  Continue all other medications  *If you need a refill on your cardiac medications before your next appointment, please call your pharmacy*  Lab Work:  Non-Fasting BMET, Magnesium test in ONE WEEK  If you have labs (blood work) drawn today and your tests are completely normal, you will receive your results only by: MyChart Message (if you have MyChart) OR A paper copy in the mail If you have any lab test that is abnormal or we need to change your treatment, we will call you to review the results.  Testing/Procedures:  Coronary CTA -- you'll get a call to schedule this once approved with insurance  Follow-Up: At [facility], you and your health needs are our priority.  As part of our continuing mission to provide you with exceptional heart care, our providers are all part of one team.  This team includes your primary Cardiologist (physician) and Advanced Practice Providers or APPs (Physician Assistants and Nurse Practitioners) who all work together to provide you with the care you need, when you need it.  Your next appointment:    6 weeks with Dr. Michele  We recommend signing up for the patient portal called MyChart.  Sign up information is provided on this After Visit Summary.  MyChart is used to connect with patients for Virtual Visits (Telemedicine).  Patients are able to view lab/test results, encounter notes, upcoming appointments, etc.  Non-urgent messages can be sent to your provider as well.   To learn more about what you can do with MyChart, go to [email or web address].   Other Instructions   Your cardiac CT will be scheduled at one of the below locations:   [facility] [address] [place], [place] [address] [phone] (Severe contrast allergies only)  OR   [facility] [address] [place],  [place] [address] [phone]  OR   [facility] [address] [place], [place] [address] [phone]  OR   [facility] and Vascular Tower [address]  [place], [place] [address]  OR   [facility] [address] [place], [place] [phone]  If scheduled at [facility], please arrive at the [facility] and Children's Entrance (Entrance C2) of [facility] 30 minutes prior to test start time. You can use the FREE valet parking offered at entrance C (encouraged to control the heart rate for the test)  Proceed to the [facility] Radiology Department (first floor) to check-in and test prep.  All radiology patients and guests should use entrance C2 at [facility], accessed from [facility], even though the hospital's physical address listed is [address]  If scheduled at the Heart and Vascular Tower at [address], please enter the parking lot using the [address] entrance and use the FREE valet service at the patient drop-off area. Enter the building and check-in with registration on the main floor.  If scheduled at [facility], please arrive to the Heart and Vascular Center 15 mins early for check-in and test prep.  There is spacious parking and easy access to the radiology department from the [facility] Heart and Vascular entrance. Please enter here and check-in with the desk attendant.   If scheduled at [facility], please arrive 30 minutes early for check-in and test prep.  Please follow these instructions carefully (  unless otherwise directed):  An IV will be required for this test and Nitroglycerin will be given.  Hold all erectile dysfunction medications at least 3 days (72 hrs) prior to test. (Ie viagra, cialis , sildenafil, tadalafil , etc)   On the Night Before the Test: Be sure to Drink plenty of water. Do not consume any caffeinated/decaffeinated beverages or  chocolate 12 hours prior to your test. Do not take any antihistamines 12 hours prior to your test.  On the Day of the Test: Drink plenty of water until 1 hour prior to the test. Do not eat any food 1 hour prior to test. You may take your regular medications prior to the test.  If you take Furosemide/Hydrochlorothiazide /Spironolactone/Chlorthalidone , please HOLD on the morning of the test. Patients who wear a continuous glucose monitor MUST remove the device prior to scanning.      After the Test: Drink plenty of water. After receiving IV contrast, you may experience a mild flushed feeling. This is normal. On occasion, you may experience a mild rash up to 24 hours after the test. This is not dangerous. If this occurs, you can take Benadryl 25 mg, Zyrtec, Claritin, or Allegra and increase your fluid intake. (Patients taking Tikosyn should avoid Benadryl, and may take Zyrtec, Claritin, or Allegra) If you experience trouble breathing, this can be serious. If it is severe call 911 IMMEDIATELY. If it is mild, please call our office.  We will call to schedule your test 2-4 weeks out understanding that some insurance companies will need an authorization prior to the service being performed.   For more information and frequently asked questions, please visit our website : [email or web address]  For non-scheduling related questions, please contact the cardiac imaging nurse navigator should you have any questions/concerns: Cardiac Imaging Nurse Navigators Direct Office Dial: [phone]   For scheduling needs, including cancellations and rescheduling, please call [place], [phone].

## 2024-01-25 NOTE — Progress Notes (Unsigned)
 Cardiology Office Note:    NAME:  Jonathan Stone    MRN: [id number] DOB:  [birth year]   PCP:  Vita Morrow, MD  Former Cardiology Providers: None Primary Cardiologist:  Madonna Large, DO, [facility] (established care 01/25/2024) Electrophysiologist:  None   Referring MD: Juvenal Harlene PENNER, DO  Reason of Consult: Hypertensive Urgency  Chief Complaint  Patient presents with   Hypertension   New Patient (Initial Visit)    History of Present Illness:    Jonathan Stone is a 68 y.o. African-American male whose past medical history and cardiovascular risk factors includes: History of right basal ganglia infarct, hypertension, hyperlipidemia, obesity  He is being seen today for the evaluation of hypertensive urgency at the request of Vann, Jessica U, DO.  Patient was hospitalized at [facility] in November 2025 and based on discharge summary principal problem was acute CVA.  Patient was diagnosed with right basal ganglia infarct and referred to cardiology by hospitalist for hypertensive urgency.  Echocardiogram done during the hospital noted LVEF of 60 to 65%, severe left ventricular hypertrophy.  Labs noted LDL of 85 mg/dL, J8r of 7.2.  Patient started on antiplatelet therapy.  Patient followed up with primary care since discharge and antihypertensive medications titrated.  Patient is accompanied by his friend Newell and patient provides verbal consent of having her present. Denies anginal chest pain or heart failure symptoms. Referred to the practice for blood pressure management after his recent stroke, patient states that he has no significant residual deficits. Home SBP ranges between 140-160 mmHg and diastolic blood pressures are between 70-80 mmHg.  Current Medications: Current Meds  Medication Sig   amLODipine  (NORVASC ) 10 MG tablet Take 1 tablet (10 mg total) by mouth daily.   aspirin  EC 81 MG tablet Take 1 tablet (81 mg total) by mouth daily. Swallow whole.   chlorthalidone   (HYGROTON ) 25 MG tablet Take 1 tablet (25 mg total) by mouth daily.   clopidogrel  (PLAVIX ) 75 MG tablet Take 1 tablet (75 mg total) by mouth daily.   empagliflozin  (JARDIANCE ) 25 MG TABS tablet Take 1 tablet (25 mg total) by mouth daily.   lisinopril  (ZESTRIL ) 30 MG tablet Take 1 tablet (30 mg total) by mouth daily at 8 pm.   metFORMIN  (GLUCOPHAGE ) 850 MG tablet Take 1 tablet (850 mg total) by mouth 2 (two) times daily with a meal. (Patient taking differently: Take 850 mg by mouth daily after supper. Take one tablet (850mg ) by mouth daily after supper.)   metoprolol  succinate (TOPROL -XL) 100 MG 24 hr tablet Take 1/2 tablet by mouth daily   rosuvastatin  (CRESTOR ) 20 MG tablet Take 1 tablet (20 mg total) by mouth daily.   [DISCONTINUED] Study - LIBREXIA-STROKE - milvexian 25 mg or placebo tablet (PI-Sethi) Take 1 tablet by mouth 2 (two) times daily. For Investigational Use Only. Bring all bottles back to next study visit. Contact [facility] with questions regarding this medication.     Allergies:    Porcine (pork) protein-containing drug products   Past Medical History: Past Medical History:  Diagnosis Date   Dyslipidemia    Hyperlipidemia LDL goal < 100 01/29/2011   Hyperlipidemia with target LDL less than 100 01/29/2011   Hypertension    Obesity     Past Surgical History: Past Surgical History:  Procedure Laterality Date   COLONOSCOPY  2010   Dr.perry   POLYPECTOMY      Social History: Social History   Tobacco Use   Smoking status: Never  Smokeless tobacco: Never  Vaping Use   Vaping status: Never Used  Substance Use Topics   Alcohol use: No   Drug use: No    Family History: Family History  Problem Relation Age of Onset   Diabetes Mother    Colon cancer Neg Hx    Colon polyps Neg Hx    Esophageal cancer Neg Hx    Rectal cancer Neg Hx    Stomach cancer Neg Hx     ROS:   Review of Systems  Cardiovascular:  Negative for chest pain,  claudication, irregular heartbeat, leg swelling, near-syncope, orthopnea, palpitations, paroxysmal nocturnal dyspnea and syncope.  Respiratory:  Negative for shortness of breath.   Hematologic/Lymphatic: Negative for bleeding problem.    Studies Reviewed:   EKG: EKG Interpretation Date/Time:  Wednesday January 25 2024 10:38:13 EST Ventricular Rate:  69 PR Interval:  176 QRS Duration:  88 QT Interval:  380 QTC Calculation: 407 R Axis:   41  Text Interpretation: Normal sinus rhythm Possible Left atrial enlargement ST & Marked T wave abnormality, consider anterolateral ischemia When compared with ECG of 29-Dec-2023 14:33, No significant change was found Confirmed by Michele Richardson [phone]) on 01/25/2024 11:48:25 AM  Echocardiogram: 12/30/2023.  1. Left ventricular ejection fraction, by estimation, is 60 to 65%. The  left ventricle has normal function. The left ventricle demonstrates  regional wall motion abnormalities (The apex is hypokinetic). There is severe concentric  left ventricular hypertrophy. Left ventricular diastolic parameters are  consistent with Grade I diastolic dysfunction (impaired relaxation).   2. Right ventricular systolic function is normal. The right ventricular  size is normal. Tricuspid regurgitation signal is inadequate for assessing  PA pressure.   3. The mitral valve is normal in structure. Trivial mitral valve  regurgitation. No evidence of mitral stenosis.   4. The aortic valve is tricuspid. Aortic valve regurgitation is not  visualized. No aortic stenosis is present.   5. The inferior vena cava is normal in size with greater than 50%  respiratory variability, suggesting right atrial pressure of 3 mmHg.   6. Cannot exclude a small PFO.   Labs:    Latest Ref Rng & Units 12/31/2023    5:27 AM 12/30/2023    4:11 AM 12/29/2023    2:27 PM  CBC  WBC 4.0 - 10.5 K/uL 7.9  9.9    Hemoglobin 13.0 - 17.0 g/dL 84.4  84.5  82.2   Hematocrit 39.0 - 52.0 % 46.7   47.6  52.0   Platelets 150 - 400 K/uL 132  144         Latest Ref Rng & Units 12/31/2023    5:27 AM 12/30/2023    4:11 AM 12/29/2023    2:27 PM  BMP  Glucose 70 - 99 mg/dL 894  896  834   BUN 8 - 23 mg/dL 17  11  16    Creatinine 0.61 - 1.24 mg/dL 8.65  8.74  8.49   Sodium 135 - 145 mmol/L 140  138  140   Potassium 3.5 - 5.1 mmol/L 3.9  3.8  4.8   Chloride 98 - 111 mmol/L 104  106  102   CO2 22 - 32 mmol/L 26  23    Calcium  8.9 - 10.3 mg/dL 8.9  8.7        Latest Ref Rng & Units 12/31/2023    5:27 AM 12/30/2023    4:11 AM 12/29/2023    2:27 PM  CMP  Glucose 70 - 99  mg/dL 894  896  834   BUN 8 - 23 mg/dL 17  11  16    Creatinine 0.61 - 1.24 mg/dL 8.65  8.74  8.49   Sodium 135 - 145 mmol/L 140  138  140   Potassium 3.5 - 5.1 mmol/L 3.9  3.8  4.8   Chloride 98 - 111 mmol/L 104  106  102   CO2 22 - 32 mmol/L 26  23    Calcium  8.9 - 10.3 mg/dL 8.9  8.7      Lab Results  Component Value Date   CHOL 141 12/30/2023   HDL 36 (L) 12/30/2023   LDLCALC 85 12/30/2023   TRIG 98 12/30/2023   CHOLHDL 3.9 12/30/2023   No results for input(s): LIPOA in the last 8760 hours. No components found for: NTPROBNP No results for input(s): PROBNP in the last 8760 hours. No results for input(s): TSH in the last 8760 hours.  Physical Exam:    Today's Vitals   01/25/24 1040  BP: (!) 171/79  Pulse: 70  Resp: 16  SpO2: 97%  Weight: 181 lb (82.1 kg)  Height: 5' 6 (1.676 m)   Body mass index is 29.21 kg/m. Wt Readings from Last 3 Encounters:  01/25/24 181 lb (82.1 kg)  01/17/24 186 lb 12.8 oz (84.7 kg)  01/02/24 183 lb 3.2 oz (83.1 kg)    Physical Exam  Constitutional: No distress.  hemodynamically stable  Neck: No JVD present.  Cardiovascular: Normal rate, regular rhythm, S1 normal and S2 normal. Exam reveals no gallop, no S3 and no S4.  No murmur heard. Pulmonary/Chest: Effort normal and breath sounds normal. No stridor. He has no wheezes. He has no rales.   Musculoskeletal:        General: No edema.     Cervical back: Neck supple.  Skin: Skin is warm.   Impression & Recommendation(s):  Impression:   ICD-10-CM   1. Benign hypertension  I10 EKG 12-Lead    chlorthalidone  (HYGROTON ) 25 MG tablet    Basic Metabolic Panel (BMET)    Magnesium    2. Nonspecific abnormal electrocardiogram (ECG) (EKG)  R94.31 Basic Metabolic Panel (BMET)    Magnesium    CT CORONARY MORPH W/CTA COR W/SCORE W/CA W/CM &/OR WO/CM    3. Mixed hyperlipidemia  E78.2     4. Hx of right basal ganglial stroke  Z86.73        Recommendation(s):  Benign hypertension Blood pressures are controlled -at office and at home Continue lisinopril  30 mg p.o. every morning. Continue Toprol -XL 50 mg p.o. every morning. Take amlodipine  10 mg p.o. every afternoon. Start chlorthalidone  25 mg p.o. every morning BMP in one week to check renal function and electrolytes Recommended goal SBP 130 mmHg Recommended getting evaluated for sleep apnea Will order renal duplex to evaluate for renal artery stenosis at the next visit  Nonspecific abnormal electrocardiogram (ECG) (EKG) During a recent stroke workup patient was noted to have severe LVH and regional wall motion abnormalities. Given cannot entirely rule CAD. Coronary CTA for further evaluation. Lopressor  100 mg x 1, 2 hours prior to the scan  Mixed hyperlipidemia LDL 85 mg/dL as of November 7974. Currently on Crestor  20 mg p.o. nightly  Hx of stroke Diagnosed with right basal ganglial stroke November 2025. Reemphasize importance of improving his modifiable cardiovascular risk factors. BP goal <130/90 LDL goal <70 Glycemic control  Patient is noted to have left ventricular hypertrophy on his recent echocardiogram.  Once blood pressures are  better controlled likely will proceed with cardiac MRI to evaluate for infiltrative cardiomyopathy, hypertensive heart disease, or HCM  Orders Placed:  Orders Placed This Encounter   Procedures   CT CORONARY MORPH W/CTA COR W/SCORE W/CA W/CM &/OR WO/CM    Standing Status:   Future    Expected Date:   02/07/2024    Expiration Date:   01/30/2025    If indicated for the ordered procedure, I authorize the administration of contrast media per Radiology protocol:   Yes    Initiate Coronary CTA Adult Protocol:   Yes    If indicated initiate Post Coronary CTA Hypotension Adult Protocol:   Yes    Does the patient have a contrast media/X-ray dye allergy?:   No    [facility]    Authorization::   FFR + Plaque Analysis/Characterization will be ordered if deemed medically necessary   Basic Metabolic Panel (BMET)   Magnesium   EKG 12-Lead     Final Medication List:    Meds ordered this encounter  Medications   chlorthalidone  (HYGROTON ) 25 MG tablet    Sig: Take 1 tablet (25 mg total) by mouth daily.    Dispense:  90 tablet    Refill:  3    There are no discontinued medications.   Current Outpatient Medications:    amLODipine  (NORVASC ) 10 MG tablet, Take 1 tablet (10 mg total) by mouth daily., Disp: 90 tablet, Rfl: 3   aspirin  EC 81 MG tablet, Take 1 tablet (81 mg total) by mouth daily. Swallow whole., Disp: 30 tablet, Rfl: 12   chlorthalidone  (HYGROTON ) 25 MG tablet, Take 1 tablet (25 mg total) by mouth daily., Disp: 90 tablet, Rfl: 3   clopidogrel  (PLAVIX ) 75 MG tablet, Take 1 tablet (75 mg total) by mouth daily., Disp: 30 tablet, Rfl: 1   empagliflozin  (JARDIANCE ) 25 MG TABS tablet, Take 1 tablet (25 mg total) by mouth daily., Disp: 90 tablet, Rfl: 3   lisinopril  (ZESTRIL ) 30 MG tablet, Take 1 tablet (30 mg total) by mouth daily at 8 pm., Disp: 90 tablet, Rfl: 1   metFORMIN  (GLUCOPHAGE ) 850 MG tablet, Take 1 tablet (850 mg total) by mouth 2 (two) times daily with a meal. (Patient taking differently: Take 850 mg by mouth daily after supper. Take one tablet (850mg ) by mouth daily after supper.), Disp: 180 tablet, Rfl: 1    metoprolol  succinate (TOPROL -XL) 100 MG 24 hr tablet, Take 1/2 tablet by mouth daily, Disp: 90 tablet, Rfl: 1   rosuvastatin  (CRESTOR ) 20 MG tablet, Take 1 tablet (20 mg total) by mouth daily., Disp: 90 tablet, Rfl: 0   Study - LIBREXIA-STROKE - milvexian 25 mg or placebo tablet (PI-Sethi), Take 1 tablet by mouth 2 (two) times daily. For Investigational Use Only. Bring all bottles back to next study visit. Contact [facility] with questions regarding this medication., Disp: 210 tablet, Rfl: 0  Consent:   N/A  Disposition:   6 weeks follow-up sooner if needed Patient may be asked to follow-up sooner based on the results of the above-mentioned testing.  Medical Decision Making:  Discussed management of at least two chronic comorbid conditions.  I have independently interpreted results of the EKG dated 01/25/2024, labs dated 12/30/2023 , and reviewed the results under studies reviewed as part of medical decision making.   I have reviewed the discharge summary from the recent hospitalization dated 12/31/2023.  Patient education provided at today's visit.   Prescription drug  management including but not limited to: addition/titration/discontinuation of medical therapy, medication reconciliation, discussed potential side effects, follow up labs either reviewed or ordered for monitoring safety.   Labs ordered - see above.   Diagnostic testing ordered - see above.  Imaging results reviewed from 12/2023 I have independently formulated and discussed the assessment and plan with the patient. This note will be shared with the patient's primary care provider to coordinate care.     Signed, Madonna Michele HAS, [facility]  A Division of [facility] [address] [place],  [address]

## 2024-01-28 ENCOUNTER — Encounter: Payer: Self-pay | Admitting: Cardiology

## 2024-01-31 ENCOUNTER — Other Ambulatory Visit (HOSPITAL_COMMUNITY): Payer: Self-pay | Admitting: Pharmacist

## 2024-01-31 MED ORDER — STUDY - LIBREXIA-STROKE - MILVEXIAN 25 MG OR PLACEBO TABLET (PI-SETHI): 1.0000 | ORAL_TABLET | Freq: Two times a day (BID) | ORAL | 0 refills | Status: AC

## 2024-02-01 LAB — BASIC METABOLIC PANEL WITH GFR
BUN/Creatinine Ratio: 17 (ref 10–24)
BUN: 22 mg/dL (ref 8–27)
CO2: 28 mmol/L (ref 20–29)
Calcium: 9.5 mg/dL (ref 8.6–10.2)
Chloride: 98 mmol/L (ref 96–106)
Creatinine, Ser: 1.31 mg/dL — ABNORMAL HIGH (ref 0.76–1.27)
Glucose: 130 mg/dL — ABNORMAL HIGH (ref 70–99)
Potassium: 3.7 mmol/L (ref 3.5–5.2)
Sodium: 140 mmol/L (ref 134–144)
eGFR: 59 mL/min/1.73 — ABNORMAL LOW (ref >59)

## 2024-02-01 LAB — MAGNESIUM: Magnesium: 2 mg/dL (ref 1.6–2.3)

## 2024-02-09 ENCOUNTER — Ambulatory Visit: Payer: Self-pay | Admitting: Cardiology

## 2024-02-14 ENCOUNTER — Ambulatory Visit (HOSPITAL_COMMUNITY)
Admission: RE | Admit: 2024-02-14 | Discharge: 2024-02-14 | Disposition: A | Discharge status: Home / Self Care | Source: Ambulatory Visit | Attending: Cardiovascular Disease | Admitting: Cardiovascular Disease

## 2024-02-14 DIAGNOSIS — R9431 Abnormal electrocardiogram [ECG] [EKG]: Secondary | ICD-10-CM | POA: Diagnosis not present

## 2024-02-14 MED ORDER — IOHEXOL 350 MG/ML SOLN
100.0000 mL | Freq: Once | INTRAVENOUS | Status: AC | PRN
  Administered 2024-02-14: 100 mL via INTRAVENOUS

## 2024-02-14 MED ORDER — NITROGLYCERIN 0.4 MG SL SUBL
0.8000 mg | SUBLINGUAL_TABLET | Freq: Once | SUBLINGUAL | Status: AC
  Administered 2024-02-14: 0.8 mg via SUBLINGUAL

## 2024-02-21 ENCOUNTER — Telehealth: Payer: Self-pay | Admitting: Family Medicine

## 2024-02-21 NOTE — Telephone Encounter (Signed)
 Guardian forms received and sent back in folder

## 2024-02-22 ENCOUNTER — Encounter: Payer: Self-pay | Admitting: *Deleted

## 2024-02-22 ENCOUNTER — Telehealth: Payer: Self-pay | Admitting: Neurology

## 2024-02-22 ENCOUNTER — Ambulatory Visit (INDEPENDENT_AMBULATORY_CARE_PROVIDER_SITE_OTHER): Admitting: Neurology

## 2024-02-22 ENCOUNTER — Encounter: Payer: Self-pay | Admitting: Neurology

## 2024-02-22 VITALS — BP 138/71 | HR 56 | Ht 66.0 in | Wt 176.6 lb

## 2024-02-22 DIAGNOSIS — I639 Cerebral infarction, unspecified: Secondary | ICD-10-CM

## 2024-02-22 DIAGNOSIS — E7849 Other hyperlipidemia: Secondary | ICD-10-CM

## 2024-02-22 DIAGNOSIS — E119 Type 2 diabetes mellitus without complications: Secondary | ICD-10-CM

## 2024-02-22 DIAGNOSIS — Z9189 Other specified personal risk factors, not elsewhere classified: Secondary | ICD-10-CM

## 2024-02-22 DIAGNOSIS — Z7985 Long-term (current) use of injectable non-insulin antidiabetic drugs: Secondary | ICD-10-CM | POA: Diagnosis not present

## 2024-02-22 NOTE — Telephone Encounter (Signed)
 Referral for sleep studies sent through [facility] to [facility]. Phone: [phone]

## 2024-02-22 NOTE — Patient Instructions (Signed)
 I had a long d/w patient about his recent stroke, risk for recurrent stroke/TIAs, personally independently reviewed imaging studies and stroke evaluation results and answered questions.Continue Plavix  75 mg daily alone and stop aspirin  now for secondary stroke prevention and maintain strict control of hypertension with blood pressure goal below 130/90, diabetes with hemoglobin A1c goal below 6.5% and lipids with LDL cholesterol goal below 70 mg/dL. I also advised the patient to eat a healthy diet with plenty of whole grains, cereals, fruits and vegetables, exercise regularly and maintain ideal body weight .check 30-day heart monitor for paroxysmal A-fib, follow-up lipid profile, hemoglobin A1c, ANA panel anticardiolipin antibodies.  Check polysomnogram for sleep apnea.  Continue participation in [place] Stroke study followup in the future with me as per study protocol.  Stroke Prevention Some medical conditions and behaviors can lead to a higher chance of having a stroke. You can help prevent a stroke by eating healthy, exercising, not smoking, and managing any medical conditions you have. Stroke is a leading cause of functional impairment. Primary prevention is particularly important because a majority of strokes are first-time events. Stroke changes the lives of not only those who experience a stroke but also their family and other caregivers. How can this condition affect me? A stroke is a medical emergency and should be treated right away. A stroke can lead to brain damage and can sometimes be life-threatening. If a person gets medical treatment right away, there is a better chance of surviving and recovering from a stroke. What can increase my risk? The following medical conditions may increase your risk of a stroke: Cardiovascular disease. High blood pressure (hypertension). Diabetes. High cholesterol. Sickle cell disease. Blood clotting disorders (hypercoagulable state). Obesity. Sleep  disorders (obstructive sleep apnea). Other risk factors include: Being older than age 29. Having a history of blood clots, stroke, or mini-stroke (transient ischemic attack, TIA). Genetic factors, such as race, ethnicity, or a family history of stroke. Smoking cigarettes or using other tobacco products. Taking birth control pills, especially if you also use tobacco. Heavy use of alcohol or drugs, especially cocaine and methamphetamine. Physical inactivity. What actions can I take to prevent this? Manage your health conditions High cholesterol levels. Eating a healthy diet is important for preventing high cholesterol. If cholesterol cannot be managed through diet alone, you may need to take medicines. Take any prescribed medicines to control your cholesterol as told by your health care provider. Hypertension. To reduce your risk of stroke, try to keep your blood pressure below 130/80. Eating a healthy diet and exercising regularly are important for controlling blood pressure. If these steps are not enough to manage your blood pressure, you may need to take medicines. Take any prescribed medicines to control hypertension as told by your health care provider. Ask your health care provider if you should monitor your blood pressure at home. Have your blood pressure checked every year, even if your blood pressure is normal. Blood pressure increases with age and some medical conditions. Diabetes. Eating a healthy diet and exercising regularly are important parts of managing your blood sugar (glucose). If your blood sugar cannot be managed through diet and exercise, you may need to take medicines. Take any prescribed medicines to control your diabetes as told by your health care provider. Get evaluated for obstructive sleep apnea. Talk to your health care provider about getting a sleep evaluation if you snore a lot or have excessive sleepiness. Make sure that any other medical conditions you have,  such as atrial  fibrillation or atherosclerosis, are managed. Nutrition Follow instructions from your health care provider about what to eat or drink to help manage your health condition. These instructions may include: Reducing your daily calorie intake. Limiting how much salt (sodium) you use to 1,500 milligrams (mg) each day. Using only healthy fats for cooking, such as olive oil, canola oil, or sunflower oil. Eating healthy foods. You can do this by: Choosing foods that are high in fiber, such as whole grains, and fresh fruits and vegetables. Eating at least 5 servings of fruits and vegetables a day. Try to fill one-half of your plate with fruits and vegetables at each meal. Choosing lean protein foods, such as lean cuts of meat, poultry without skin, fish, tofu, beans, and nuts. Eating low-fat dairy products. Avoiding foods that are high in sodium. This can help lower blood pressure. Avoiding foods that have saturated fat, trans fat, and cholesterol. This can help prevent high cholesterol. Avoiding processed and prepared foods. Counting your daily carbohydrate intake.  Lifestyle If you drink alcohol: Limit how much you have to: 0-1 drink a day for women who are not pregnant. 0-2 drinks a day for men. Know how much alcohol is in your drink. In the [place] one drink equals one 12 oz bottle of beer ( ), one 5 oz glass of wine ( ), or one 1 oz glass of hard liquor (44mL). Do not use any products that contain nicotine or tobacco. These products include cigarettes, chewing tobacco, and vaping devices, such as e-cigarettes. If you need help quitting, ask your health care provider. Avoid secondhand smoke. Do not use drugs. Activity  Try to stay at a healthy weight. Get at least 30 minutes of exercise on most days, such as: Fast walking. Biking. Swimming. Medicines Take over-the-counter and prescription medicines only as told by your health care provider. Aspirin  or blood thinners  (antiplatelets or anticoagulants) may be recommended to reduce your risk of forming blood clots that can lead to stroke. Avoid taking birth control pills. Talk to your health care provider about the risks of taking birth control pills if: You are over 53 years old. You smoke. You get very bad headaches. You have had a blood clot. Where to find more information American Stroke Association: www.strokeassociation.org Get help right away if: You or a loved one has any symptoms of a stroke. BE FAST is an easy way to remember the main warning signs of a stroke: B - Balance. Signs are dizziness, sudden trouble walking, or loss of balance. E - Eyes. Signs are trouble seeing or a sudden change in vision. F - Face. Signs are sudden weakness or numbness of the face, or the face or eyelid drooping on one side. A - Arms. Signs are weakness or numbness in an arm. This happens suddenly and usually on one side of the body. S - Speech. Signs are sudden trouble speaking, slurred speech, or trouble understanding what people say. T - Time. Time to call emergency services. Write down what time symptoms started. You or a loved one has other signs of a stroke, such as: A sudden, severe headache with no known cause. Nausea or vomiting. Seizure. These symptoms may represent a serious problem that is an emergency. Do not wait to see if the symptoms will go away. Get medical help right away. Call your local emergency services (911 in the [place] Do not drive yourself to the hospital. Summary You can help to prevent a stroke by eating healthy, exercising, not smoking, limiting  alcohol intake, and managing any medical conditions you may have. Do not use any products that contain nicotine or tobacco. These include cigarettes, chewing tobacco, and vaping devices, such as e-cigarettes. If you need help quitting, ask your health care provider. Remember BE FAST for warning signs of a stroke. Get help right away if you or a  loved one has any of these signs. This information is not intended to replace advice given to you by your health care provider. Make sure you discuss any questions you have with your health care provider. Document Revised: 01/04/2022 Document Reviewed: 01/04/2022 Elsevier Patient Education  2024 [employer].

## 2024-02-22 NOTE — Progress Notes (Signed)
 Patient enrolled for [employer]/ [employer] to ship a 30 day cardiac event monitor to his address on file. Letter with instructions mailed to patient. Dr. Michele to read.

## 2024-02-22 NOTE — Progress Notes (Signed)
 " [facility] [address] [place]. [place] [address] [phone]       OFFICE CONSULT NOTE  Mr. Jamarkus Lisbon Date of Birth:  [birth year] Medical Record Number:  [id number]   Referring MD: Harlene Salinas  Reason for Referral: Stroke  HPI: Mr. Jonathan Stone is a 69 year old pleasant African-American male seen today for initial office consultation visit for stroke.  History is obtained from the patient and review of electronic medical records.  I personally reviewed pertinent available imaging films in PACS.  He has past medical history of hypertension, diabetes, hyperlipidemia, obesity presented on 12/29/2023 with sudden onset of left facial droop, left arm numbness and slurred speech.  Presented outside time window for thrombolysis.  NIH stroke scale was 2 on admission.  CT head showed focal hypodensity in the right basal ganglia suspicious for acute infarct.  CT angiogram of the head and neck showed no large vessel stenosis or occlusion.  Bilateral fetal origin of posterior cerebral arteries: Diminutive vertebrobasilar system.  MRI scan showed a large 2.6 cm perforator type infarct involving right basal ganglia with some mild petechial blood products.  Transthoracic echo showed ejection fraction of 60 to 65% with severe concentric left ventricular hypertrophy.  Telemetry monitoring did not reveal any cardiac arrhythmia.  LDL cholesterol was 85 mg percent.  Hemoglobin A1c was 7.2.  Urine toxin was negative.  Patient started on dual antiplatelet therapy aspirin  and Plavix  for 3 days followed by Plavix  alone.  Patient did well and was discharged home.  He states his next full neurological recovery and has no residual weakness numbness or speech difficulties.  He is back to his baseline and is independent in all activities of daily living.  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  States his blood pressure is under good control today it is 138/78.  Tolerating Crestor  well without muscle aches).  States his sugars are good close does not check them regularly.  He is participating in the Librexia stroke prevention study(standard of care antiplatelet therapy with or without Milvexian- a new factor 11 inhibitor ) .  Patient is tolerating a statin medication well without any side effects.  He has no complaints today.  He denies any prior history of strokes, TIAs, seizures or neurological problems.  He does admit to snoring evaluated for sleep apnea yes.  She denies any palpitations or fainting episodes of cardiac arrhythmia.  He does have a very high calcium  score coronary CTA on 02/14/2024 but denies any symptomatic angina. ROS:   14 system review of systems is positive for numbness, weakness, slurred speech bruising all other systems negative  PMH:  Past Medical History:  Diagnosis Date   Dyslipidemia    Hyperlipidemia LDL goal < 100 01/29/2011   Hyperlipidemia with target LDL less than 100 01/29/2011   Hypertension    Obesity Asymptomatic coronary artery disease with high calcium  score     Social History:  Social History   Socioeconomic History   Marital status: Single    Spouse name: Not on file   Number of children: Not on file   Years of education: Not on file   Highest education level: Not on file  Occupational History   Not on file  Tobacco Use   Smoking status: Never   Smokeless tobacco: Never  Vaping Use  Vaping status: Never Used  Substance and Sexual Activity   Alcohol use: No   Drug use: No   Sexual activity: Yes    Birth control/protection: Condom  Other Topics Concern   Not on file  Social History Narrative   Not on file   Social Drivers of Health   Tobacco Use: Low Risk (02/22/2024)   Patient History    Smoking Tobacco Use: Never    Smokeless Tobacco Use: Never    Passive Exposure:  Not on file  Financial Resource Strain: Not on file  Food Insecurity: No Food Insecurity (12/30/2023)   Epic    Worried About Programme Researcher, Broadcasting/film/video in the Last Year: Never true    Ran Out of Food in the Last Year: Never true  Transportation Needs: No Transportation Needs (12/30/2023)   Epic    Lack of Transportation (Medical): No    Lack of Transportation (Non-Medical): No  Physical Activity: Not on file  Stress: Not on file  Social Connections: Moderately Integrated (12/30/2023)   Social Connection and Isolation Panel    Frequency of Communication with Friends and Family: More than three times a week    Frequency of Social Gatherings with Friends and Family: Once a week    Attends Religious Services: 1 to 4 times per year    Active Member of [employer] or Organizations: No    Attends Banker Meetings: Never    Marital Status: Married  Catering Manager Violence: Not At Risk (12/30/2023)   Epic    Fear of Current or Ex-Partner: No    Emotionally Abused: No    Physically Abused: No    Sexually Abused: No  Depression (PHQ2-9): Low Risk (11/08/2023)   Depression (PHQ2-9)    PHQ-2 Score: 0  Alcohol Screen: Not on file  Housing: Low Risk (12/30/2023)   Epic    Unable to Pay for Housing in the Last Year: No    Number of Times Moved in the Last Year: 1    Homeless in the Last Year: No  Utilities: Not At Risk (12/30/2023)   Epic    Threatened with loss of utilities: No  Health Literacy: Not on file    Medications:  Medications Ordered Prior to Encounter[1]  Allergies:  Allergies[2]  Physical Exam General: well developed, well nourished pleasant middle-age African-American male, seated, in no evident distress Head: head normocephalic and atraumatic.   Neck: supple with no carotid or supraclavicular bruits Cardiovascular: regular rate and rhythm, no murmurs Musculoskeletal: no deformity Skin:  no rash/petichiae Vascular:  Normal pulses all extremities  Neurologic  Exam Mental Status: Awake and fully alert. Oriented to place and time. Recent and remote memory intact. Attention span, concentration and fund of knowledge appropriate. Mood and affect appropriate.  Cranial Nerves: Fundoscopic exam reveals sharp disc margins. Pupils equal, briskly reactive to light. Extraocular movements full without nystagmus. Visual fields full to confrontation. Hearing intact. Facial sensation intact. Face, tongue, palate moves normally and symmetrically.  Motor: Normal bulk and tone. Normal strength in all tested extremity muscles.  Diminished fine finger movements on the left.  Always right over left upper extremity. Sensory.: intact to touch , pinprick , position and vibratory sensation.  Coordination: Rapid alternating movements normal in all extremities. Finger-to-nose and heel-to-shin performed accurately bilaterally. Gait and Station: Arises from chair without difficulty. Stance is normal. Gait demonstrates normal stride length and balance . Able to heel, toe and tandem walk without difficulty.  Reflexes: 1+ and symmetric. Toes downgoing.  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  Stroke etiology mild vessel disease versus cryptogenic given large size of the infarct.  Vascular risk factors of diabetes, hypertension, hyperlipidemia at risk for sleep apnea.  Patient is participating in the Librexia stroke prevention study     PLAN:I had a long d/w patient about his recent stroke, risk for recurrent stroke/TIAs, personally independently reviewed imaging studies and stroke evaluation results and answered questions.Continue Plavix  75 mg daily alone and stop aspirin  now for secondary stroke prevention and maintain strict control of hypertension with blood pressure goal below 130/90, diabetes with hemoglobin A1c goal below 6.5% and lipids  with LDL cholesterol goal below 70 mg/dL. I also advised the patient to eat a healthy diet with plenty of whole grains, cereals, fruits and vegetables, exercise regularly and maintain ideal body weight .check 30-day heart monitor for paroxysmal A-fib, follow-up lipid profile, hemoglobin A1c, ANA panel anticardiolipin antibodies.  Check polysomnogram for sleep apnea.  Continue participation in [facility] [place] Stroke study and he was counseled to be compliant with taking his  study medication and followup in the future with me as per study protocol.   I personally spent a total of 50 minutes in the care of the patient today including getting/reviewing separately obtained history, performing a medically appropriate exam/evaluation, counseling and educating, placing orders, referring and communicating with other health care professionals, documenting clinical information in the EHR, independently interpreting results, and coordinating care.       Eather Popp, MD  Note: This document was prepared with digital dictation and possible smart phrase technology. Any transcriptional errors that result from this process are unintentional.      [1]  Current Outpatient Medications on File Prior to Visit  Medication Sig Dispense Refill   amLODipine  (NORVASC ) 10 MG tablet Take 1 tablet (10 mg total) by mouth daily. 90 tablet 3   aspirin  EC 81 MG tablet Take 1 tablet (81 mg total) by mouth daily. Swallow whole. 30 tablet 12   chlorthalidone  (HYGROTON ) 25 MG tablet Take 1 tablet (25 mg total) by mouth daily. 90 tablet 3   clopidogrel  (PLAVIX ) 75 MG tablet Take 1 tablet (75 mg total) by mouth daily. 30 tablet 1   empagliflozin  (JARDIANCE ) 25 MG TABS tablet Take 1 tablet (25 mg total) by mouth daily. 90 tablet 3   lisinopril  (ZESTRIL ) 30 MG tablet Take 1 tablet (30 mg total) by mouth daily at 8 pm. 90 tablet 1   metFORMIN  (GLUCOPHAGE ) 850 MG tablet Take 1 tablet (850 mg total) by mouth 2 (two) times daily with a meal.  (Patient taking differently: Take 850 mg by mouth daily after supper. Take one tablet (850mg ) by mouth daily after supper.) 180 tablet 1   metoprolol  succinate (TOPROL -XL) 100 MG 24 hr tablet Take 1/2 tablet by mouth daily 90 tablet 1   rosuvastatin  (CRESTOR ) 20 MG tablet Take 1 tablet (20 mg total) by mouth daily. 90 tablet 0   Study - LIBREXIA-STROKE - milvexian 25 mg or placebo tablet (PI-Daisi Kentner) Take 1 tablet by mouth 2 (two) times daily. For Investigational Use Only. Bring all bottles back to next study visit. Contact [facility] with questions regarding this medication. 210 tablet 0   No current facility-administered medications on file prior to visit.  [2]  Allergies Allergen Reactions   Porcine (Pork) Protein-Containing Drug Products  DOES NOT EAT PORK   "

## 2024-02-24 NOTE — Telephone Encounter (Signed)
 Spoke with patient. Relayed Dr. Tyree note. All concerns addressed.

## 2024-02-24 NOTE — Telephone Encounter (Signed)
-----   Message from [place], [place] sent at 02/09/2024  7:10 PM EST ----- Mr. Jonathan Stone [facility],  These labs should reflect recent initiation of chlorthalidone .  Please verify. If so, renal function and electrolytes are stable. Proceed with coronary CTA. Please ask what his home blood pressures been running.  Dr.Tolia

## 2024-02-25 NOTE — Patient Instructions (Incomplete)
 Healthy Eating, Adult Healthy eating may help you get and keep a healthy body weight, reduce the risk of chronic disease, and live a long and productive life. It is important to follow a healthy eating pattern. Your nutritional and calorie needs should be met mainly by different nutrient-rich foods. What are tips for following this plan? Reading food labels Read labels and choose the following: Reduced or low sodium products. Juices with 100% fruit juice. Foods with low saturated fats (<3 g per serving) and high polyunsaturated and monounsaturated fats. Foods with whole grains, such as whole wheat, cracked wheat, brown rice, and wild rice. Whole grains that are fortified with folic acid . This is recommended for females who are pregnant or who want to become pregnant. Read labels and do not eat or drink the following: Foods or drinks with added sugars. These include foods that contain brown sugar, corn sweetener, corn syrup, dextrose , fructose, glucose, high-fructose corn syrup, honey, invert sugar, lactose, malt syrup, maltose, molasses, raw sugar, sucrose, trehalose, or turbinado sugar. Limit your intake of added sugars to less than 10% of your total daily calories. Do not eat more than the following amounts of added sugar per day: 6 teaspoons (25 g) for females. 9 teaspoons (38 g) for males. Foods that contain processed or refined starches and grains. Refined grain products, such as white flour, degermed cornmeal, white bread, and white rice. Shopping Choose nutrient-rich snacks, such as vegetables, whole fruits, and nuts. Avoid high-calorie and high-sugar snacks, such as potato chips, fruit snacks, and candy. Use oil-based dressings and spreads on foods instead of solid fats such as butter, margarine, sour cream, or cream cheese. Limit pre-made sauces, mixes, and instant products such as flavored rice, instant noodles, and ready-made pasta. Try more plant-protein sources, such as tofu,  tempeh, black beans, edamame, lentils, nuts, and seeds. Explore eating plans such as the Mediterranean diet or vegetarian diet. Try heart-healthy dips made with beans and healthy fats like hummus and guacamole. Vegetables go great with these. Cooking Use oil to saut or stir-fry foods instead of solid fats such as butter, margarine, or lard. Try baking, boiling, grilling, or broiling instead of frying. Remove the fatty part of meats before cooking. Steam vegetables in water  or broth. Meal planning  At meals, imagine dividing your plate into fourths: One-half of your plate is fruits and vegetables. One-fourth of your plate is whole grains. One-fourth of your plate is protein, especially lean meats, poultry, eggs, tofu, beans, or nuts. Include low-fat dairy as part of your daily diet. Lifestyle Choose healthy options in all settings, including home, work, school, restaurants, or stores. Prepare your food safely: Wash your hands after handling raw meats. Where you prepare food, keep surfaces clean by regularly washing with hot, soapy water . Keep raw meats separate from ready-to-eat foods, such as fruits and vegetables. Cook seafood, meat, poultry, and eggs to the recommended temperature. Get a food thermometer. Store foods at safe temperatures. In general: Keep cold foods at 34F (4.4C) or below. Keep hot foods at 134F (60C) or above. Keep your freezer at [facility] (-17.8C) or below. Foods are not safe to eat if they have been between the temperatures of 40-134F (4.4-60C) for more than 2 hours. What foods should I eat? Fruits Aim to eat 1-2 cups of fresh, canned (in natural juice), or frozen fruits each day. One cup of fruit equals 1 small apple, 1 large banana, 8 large strawberries, 1 cup (237 g) canned fruit,  cup (82 g) dried fruit,  or 1 cup (240 mL) 100% juice. Vegetables Aim to eat 2-4 cups of fresh and frozen vegetables each day, including different varieties and colors. One cup  of vegetables equals 1 cup (91 g) broccoli or cauliflower florets, 2 medium carrots, 2 cups (150 g) raw, leafy greens, 1 large tomato, 1 large bell pepper, 1 large sweet potato, or 1 medium white potato. Grains Aim to eat 5-10 ounce-equivalents of whole grains each day. Examples of 1 ounce-equivalent of grains include 1 slice of bread, 1 cup (40 g) ready-to-eat cereal, 3 cups (24 g) popcorn, or  cup (93 g) cooked rice. Meats and other proteins Try to eat 5-7 ounce-equivalents of protein each day. Examples of 1 ounce-equivalent of protein include 1 egg,  oz nuts (12 almonds, 24 pistachios, or 7 walnut halves), 1/4 cup (90 g) cooked beans, 6 tablespoons (90 g) hummus or 1 tablespoon (16 g) peanut butter. A cut of meat or fish that is the size of a deck of cards is about 3-4 ounce-equivalents (85 g). Of the protein you eat each week, try to have at least 8 sounce (227 g) of seafood. This is about 2 servings per week. This includes salmon, trout, herring, sardines, and anchovies. Dairy Aim to eat 3 cup-equivalents of fat-free or low-fat dairy each day. Examples of 1 cup-equivalent of dairy include 1 cup (240 mL) milk, 8 ounces (250 g) yogurt, 1 ounces (44 g) natural cheese, or 1 cup (240 mL) fortified soy milk. Fats and oils Aim for about 5 teaspoons (21 g) of fats and oils per day. Choose monounsaturated fats, such as canola and olive oils, mayonnaise made with olive oil or avocado oil, avocados, peanut butter, and most nuts, or polyunsaturated fats, such as sunflower, corn, and soybean oils, walnuts, pine nuts, sesame seeds, sunflower seeds, and flaxseed. Beverages Aim for 6 eight-ounce glasses of water  per day. Limit coffee to 3-5 eight-ounce cups per day. Limit caffeinated beverages that have added calories, such as soda and energy drinks. If you drink alcohol: Limit how much you have to: 0-1 drink a day if you are male. 0-2 drinks a day if you are male. Know how much alcohol is in your drink.  In the [place] one drink is one 12 oz bottle of beer (355 mL), one 5 oz glass of wine (148 mL), or one 1 oz glass of hard liquor (44 mL). Seasoning and other foods Try not to add too much salt to your food. Try using herbs and spices instead of salt. Try not to add sugar to food. This information is based on [place] nutrition guidelines. To learn more, visit [employer]. Exact amounts may vary. You may need different amounts. This information is not intended to replace advice given to you by your health care provider. Make sure you discuss any questions you have with your health care provider. Document Revised: 11/02/2021 Document Reviewed: 11/02/2021 Elsevier Patient Education  2024 [employer].

## 2024-02-26 LAB — CARDIOLIPIN ANTIBODIES, IGG, IGM, IGA
Anticardiolipin IgA: <9 U/mL (ref 0–11)
Anticardiolipin IgG: <9 (ref 0–14)
Anticardiolipin IgM: <9 [MPL'U]/mL (ref 0–12)

## 2024-02-26 LAB — ANA: Anti Nuclear Antibody (ANA): NEGATIVE

## 2024-02-27 NOTE — Telephone Encounter (Signed)
 Guardian forms faxed with records 02/23/24

## 2024-02-29 ENCOUNTER — Encounter: Admitting: Nurse Practitioner

## 2024-03-01 ENCOUNTER — Ambulatory Visit: Payer: Self-pay | Admitting: Neurology

## 2024-03-05 ENCOUNTER — Ambulatory Visit: Attending: Cardiology | Admitting: Cardiology

## 2024-03-05 ENCOUNTER — Encounter: Payer: Self-pay | Admitting: Cardiology

## 2024-03-05 ENCOUNTER — Other Ambulatory Visit (HOSPITAL_COMMUNITY): Payer: Self-pay

## 2024-03-05 VITALS — BP 148/82 | HR 59 | Resp 16 | Ht 66.0 in | Wt 178.7 lb

## 2024-03-05 DIAGNOSIS — R0683 Snoring: Secondary | ICD-10-CM | POA: Diagnosis not present

## 2024-03-05 DIAGNOSIS — I10 Essential (primary) hypertension: Secondary | ICD-10-CM | POA: Diagnosis not present

## 2024-03-05 DIAGNOSIS — I517 Cardiomegaly: Secondary | ICD-10-CM

## 2024-03-05 DIAGNOSIS — E782 Mixed hyperlipidemia: Secondary | ICD-10-CM

## 2024-03-05 DIAGNOSIS — Z8673 Personal history of transient ischemic attack (TIA), and cerebral infarction without residual deficits: Secondary | ICD-10-CM | POA: Diagnosis not present

## 2024-03-05 DIAGNOSIS — R931 Abnormal findings on diagnostic imaging of heart and coronary circulation: Secondary | ICD-10-CM | POA: Diagnosis not present

## 2024-03-05 MED ORDER — AMLODIPINE BESYLATE 10 MG PO TABS: 10.0000 mg | ORAL_TABLET | Freq: Every day | ORAL | Status: AC

## 2024-03-05 MED ORDER — CARVEDILOL 6.25 MG PO TABS: 6.2500 mg | ORAL_TABLET | Freq: Two times a day (BID) | ORAL | 3 refills | Status: AC

## 2024-03-05 MED ORDER — CHLORTHALIDONE 25 MG PO TABS: 25.0000 mg | ORAL_TABLET | Freq: Every day | ORAL | Status: AC

## 2024-03-05 MED ORDER — LISINOPRIL 40 MG PO TABS: 40.0000 mg | ORAL_TABLET | Freq: Every day | ORAL | 3 refills | Status: AC

## 2024-03-05 NOTE — Progress Notes (Signed)
 "   Cardiology Office Note:    NAME:  Jonathan Stone    MRN: [id number] DOB:  1955-05-01   PCP:  Vita Morrow, MD  Former Cardiology Providers: None Primary Cardiologist:  Madonna Large, DO, [facility] (established care 01/25/2024) Electrophysiologist:  None   Chief Complaint  Patient presents with   Hypertension   Follow-up    History of Present Illness:    Jonathan Stone is a 69 y.o. African-American male whose past medical history and cardiovascular risk factors includes: History of right basal ganglia infarct, hypertension, hyperlipidemia, obesity  Referred to practice for hypertensive urgency.  Hospitalized at [facility] in November 2025 and based on discharge summary principal problem was acute CVA.  Patient was diagnosed with right basal ganglia infarct and referred to cardiology by hospitalist for hypertensive urgency.  Echocardiogram done during the hospital noted LVEF of 60 to 65%, severe left ventricular hypertrophy.  Labs noted LDL of 85 mg/dL, J8r of 7.2.  Patient started on antiplatelet therapy.  Patient followed up with primary care since discharge and antihypertensive medications titrated.  Defer to cardiology for hypertension management given the recent stroke initial home blood pressures would range between 140-160 mmHg and DBP between 70-80 mmHg.  Office visit December 2025: Recommended which medications to take in the morning versus at night. Started chlorthalidone  25 mg p.o. every morning.  Also referred him to sleep medicine for evaluation in order renal duplex to evaluate for renal artery stenosis.  Given the nonspecific EKG changes, recent stroke, LVH and regional wall motion abnormalities recommended a coronary CTA for further evaluation as well.  Patient presents today for follow-up.  Today patient is accompanied by his son Jordan. He denies anginal chest pain or heart failure symptoms. Home blood pressure still not well-controlled, SBP ranging between  149-177 mmHg. He does not take the blood pressure medications as prescribed.  He still takes all of them in the morning. He did well with chlorthalidone , renal function remained stable. Results of the coronary CTA reviewed with the patient and son at today's visit. Sleep apnea study as well as renal duplex are also pending.  Current Medications: Current Meds  Medication Sig   amLODipine  (NORVASC ) 10 MG tablet Take 1 tablet (10 mg total) by mouth daily. At night   carvedilol  (COREG ) 6.25 MG tablet Take 1 tablet (6.25 mg total) by mouth 2 (two) times daily.   chlorthalidone  (HYGROTON ) 25 MG tablet Take 1 tablet (25 mg total) by mouth daily. In the morning   lisinopril  (ZESTRIL ) 40 MG tablet Take 1 tablet (40 mg total) by mouth daily. At night     Allergies:    Porcine (pork) protein-containing drug products   Past Medical History: Past Medical History:  Diagnosis Date   Dyslipidemia    Hyperlipidemia LDL goal < 100 01/29/2011   Hyperlipidemia with target LDL less than 100 01/29/2011   Hypertension    Obesity     Past Surgical History: Past Surgical History:  Procedure Laterality Date   COLONOSCOPY  2010   Dr.perry   POLYPECTOMY      Social History: Social History   Tobacco Use   Smoking status: Never   Smokeless tobacco: Never  Vaping Use   Vaping status: Never Used  Substance Use Topics   Alcohol use: No   Drug use: No    Family History: Family History  Problem Relation Age of Onset   Diabetes Mother    Colon cancer Neg Hx    Colon  polyps Neg Hx    Esophageal cancer Neg Hx    Rectal cancer Neg Hx    Stomach cancer Neg Hx     ROS:   Review of Systems  Cardiovascular:  Negative for chest pain, claudication, irregular heartbeat, leg swelling, near-syncope, orthopnea, palpitations, paroxysmal nocturnal dyspnea and syncope.  Respiratory:  Negative for shortness of breath.   Hematologic/Lymphatic: Negative for bleeding problem.    Studies Reviewed:      Echocardiogram: 12/30/2023: LVEF 60 to 65%, regional wall motion normalities, severe LVH, grade 1 diastolic dysfunction, no significant valvular heart disease, cannot exclude small PFO, estimated RAP 3 mmHg.  CCTA  02/14/2024 1. Coronary calcium  score of 258. This was 83 percentile for age-,sex, and race-matched controls.  2. Severe LVH.  3. Normal coronary origin with right dominance.  4. Minimal (0-24) 3 vessel plaque as outlined.  5.  Radiology over read: No acute extracardiac findings  RECOMMENDATIONS: CAD-RADS 1: Minimal non-obstructive CAD (0-24%). Consider non-atherosclerotic causes of chest pain. Consider preventive therapy and risk factor modification.   Labs:    Latest Ref Rng & Units 12/31/2023    5:27 AM 12/30/2023    4:11 AM 12/29/2023    2:27 PM  CBC  WBC 4.0 - 10.5 K/uL 7.9  9.9    Hemoglobin 13.0 - 17.0 g/dL 84.4  84.5  82.2   Hematocrit 39.0 - 52.0 % 46.7  47.6  52.0   Platelets 150 - 400 K/uL 132  144         Latest Ref Rng & Units 02/01/2024    8:31 AM 12/31/2023    5:27 AM 12/30/2023    4:11 AM  BMP  Glucose 70 - 99 mg/dL 869  894  896   BUN 8 - 27 mg/dL 22  17  11    Creatinine 0.76 - 1.27 mg/dL 8.68  8.65  8.74   BUN/Creat Ratio 10 - 24 17     Sodium 134 - 144 mmol/L 140  140  138   Potassium 3.5 - 5.2 mmol/L 3.7  3.9  3.8   Chloride 96 - 106 mmol/L 98  104  106   CO2 20 - 29 mmol/L 28  26  23    Calcium  8.6 - 10.2 mg/dL 9.5  8.9  8.7       Latest Ref Rng & Units 02/01/2024    8:31 AM 12/31/2023    5:27 AM 12/30/2023    4:11 AM  CMP  Glucose 70 - 99 mg/dL 869  894  896   BUN 8 - 27 mg/dL 22  17  11    Creatinine 0.76 - 1.27 mg/dL 8.68  8.65  8.74   Sodium 134 - 144 mmol/L 140  140  138   Potassium 3.5 - 5.2 mmol/L 3.7  3.9  3.8   Chloride 96 - 106 mmol/L 98  104  106   CO2 20 - 29 mmol/L 28  26  23    Calcium  8.6 - 10.2 mg/dL 9.5  8.9  8.7     Lab Results  Component Value Date   CHOL 141 12/30/2023   HDL 36 (L) 12/30/2023    LDLCALC 85 12/30/2023   TRIG 98 12/30/2023   CHOLHDL 3.9 12/30/2023   No results for input(s): LIPOA in the last 8760 hours. No components found for: NTPROBNP No results for input(s): PROBNP in the last 8760 hours. No results for input(s): TSH in the last 8760 hours.  Physical Exam:    Today's Vitals  03/05/24 0840  BP: (!) 148/82  Pulse: (!) 59  Resp: 16  SpO2: 98%  Weight: 178 lb 11.2 oz (81.1 kg)  Height: 5' 6 (1.676 m)   Body mass index is 28.84 kg/m. Wt Readings from Last 3 Encounters:  03/05/24 178 lb 11.2 oz (81.1 kg)  02/22/24 176 lb 9.6 oz (80.1 kg)  01/25/24 181 lb (82.1 kg)    Physical Exam  Constitutional: No distress.  hemodynamically stable  Neck: No JVD present.  Cardiovascular: Normal rate, regular rhythm, S1 normal and S2 normal. Exam reveals no gallop, no S3 and no S4.  No murmur heard. Pulmonary/Chest: Effort normal and breath sounds normal. No stridor. He has no wheezes. He has no rales.  Musculoskeletal:        General: No edema.     Cervical back: Neck supple.  Skin: Skin is warm.   Impression & Recommendation(s):  Impression:   ICD-10-CM   1. Benign hypertension  I10 amLODipine  (NORVASC ) 10 MG tablet    chlorthalidone  (HYGROTON ) 25 MG tablet    lisinopril  (ZESTRIL ) 40 MG tablet    carvedilol  (COREG ) 6.25 MG tablet    Basic Metabolic Panel (BMET)    VAS US  RENAL ARTERY DUPLEX    AMB Referral to [facility] Pharm-D    2. LVH (left ventricular hypertrophy)  I51.7 MR CARDIAC MORPHOLOGY W WO CONTRAST    Hemoglobin and hematocrit, blood    3. Agatston coronary artery calcium  score between 100 and 400  R93.1     4. Mixed hyperlipidemia  E78.2     5. Hx of right basal ganglial stroke  Z86.73     6. Snoring  R06.83 Ambulatory referral to Sleep Studies      Recommendation(s):  Benign hypertension Office at home blood pressures are still not well-controlled. Discontinue metoprolol  50 mg p.o. daily. Start carvedilol  6.25 mg p.o.  twice daily. Recommend taking amlodipine  10 mg p.o. every afternoon. Recommended taking melatonin 25 mg p.o. every morning Recommended taking Jardiance  25 mg p.o. every morning. Increase lisinopril  from 30 mg p.o. every morning to 40 mg p.o. every afternoon BMP in one week to check renal function and electrolytes Recommended goal SBP 130 mmHg Will refer him to sleep medicine again for evaluation for sleep apnea  Will reorder renal duplex to evaluate for renal artery stenosis  Referred to Pharm.D. for further medication titration, in the interim  Left ventricular hypertrophy:  Likely secondary to hypertensive heart disease due to long history of hypertension However other possibilities such as infiltrative heart disease cannot be ruled out. Cardiac MRI for further evaluation  Coronary calcification Total CAC 258, 83rd percentile Minimal nonobstructive coronary artery disease per coronary CTA December 2025 Already on aspirin  and lipid-lowering agents Reemphasized the importance of secondary prevention with focus on improving the modifiable cardiovascular risk factors such as glycemic control, lipid management, blood pressure control.  Mixed hyperlipidemia LDL 85 mg/dL as of November 7974. Currently on Crestor  20 mg p.o. nightly Recommend goal LDL <70 mg/dL  Hx of stroke Diagnosed with right basal ganglial stroke November 2025. Reemphasize importance of improving his modifiable cardiovascular risk factors. BP goal <130/90 LDL goal <70 Glycemic control  Snoring: Evaluate for sleep apnea  Orders Placed:  Orders Placed This Encounter  Procedures   MR CARDIAC MORPHOLOGY W WO CONTRAST    Standing Status:   Future    Expected Date:   04/05/2024    Expiration Date:   03/05/2025    If indicated for the ordered procedure,  I authorize the administration of contrast media per Radiology protocol:   Yes    What is the patient's sedation requirement?:   No Sedation    Does the patient have  a pacemaker or implanted devices?:   No    Preferred imaging location?:   [facility] (table limit - 500lbs)   Basic Metabolic Panel (BMET)    Standing Status:   Future    Expected Date:   03/12/2024    Expiration Date:   03/05/2025   Hemoglobin and hematocrit, blood   Ambulatory referral to Sleep Studies    Referral Priority:   Routine    Referral Type:   Consultation    Referral Reason:   Specialty Services Required    Referred to Provider:   Harl Jayson CROME, MD    Number of Visits Requested:   1   AMB Referral to [facility] Pharm-D    Referral Priority:   Routine    Referral Type:   Consultation    Referral Reason:   Specialty Services Required    Number of Visits Requested:   1     Final Medication List:    Meds ordered this encounter  Medications   amLODipine  (NORVASC ) 10 MG tablet    Sig: Take 1 tablet (10 mg total) by mouth daily. At night   chlorthalidone  (HYGROTON ) 25 MG tablet    Sig: Take 1 tablet (25 mg total) by mouth daily. In the morning   lisinopril  (ZESTRIL ) 40 MG tablet    Sig: Take 1 tablet (40 mg total) by mouth daily. At night    Dispense:  90 tablet    Refill:  3   carvedilol  (COREG ) 6.25 MG tablet    Sig: Take 1 tablet (6.25 mg total) by mouth 2 (two) times daily.    Dispense:  180 tablet    Refill:  3    Medications Discontinued During This Encounter  Medication Reason   lisinopril  (ZESTRIL ) 30 MG tablet Dose change   amLODipine  (NORVASC ) 10 MG tablet Change in therapy   chlorthalidone  (HYGROTON ) 25 MG tablet Change in therapy     Current Outpatient Medications:    amLODipine  (NORVASC ) 10 MG tablet, Take 1 tablet (10 mg total) by mouth daily. At night, Disp: , Rfl:    carvedilol  (COREG ) 6.25 MG tablet, Take 1 tablet (6.25 mg total) by mouth 2 (two) times daily., Disp: 180 tablet, Rfl: 3   chlorthalidone  (HYGROTON ) 25 MG tablet, Take 1 tablet (25 mg total) by mouth daily. In the morning, Disp: , Rfl:    lisinopril  (ZESTRIL ) 40 MG tablet,  Take 1 tablet (40 mg total) by mouth daily. At night, Disp: 90 tablet, Rfl: 3   clopidogrel  (PLAVIX ) 75 MG tablet, Take 1 tablet (75 mg total) by mouth daily., Disp: 30 tablet, Rfl: 1   empagliflozin  (JARDIANCE ) 25 MG TABS tablet, Take 1 tablet (25 mg total) by mouth daily., Disp: 90 tablet, Rfl: 3   metFORMIN  (GLUCOPHAGE ) 850 MG tablet, Take 1 tablet (850 mg total) by mouth 2 (two) times daily with a meal. (Patient taking differently: Take 850 mg by mouth daily after supper. Take one tablet (850mg ) by mouth daily after supper.), Disp: 180 tablet, Rfl: 1   metoprolol  succinate (TOPROL -XL) 100 MG 24 hr tablet, Take 1/2 tablet by mouth daily, Disp: 90 tablet, Rfl: 1   rosuvastatin  (CRESTOR ) 20 MG tablet, Take 1 tablet (20 mg total) by mouth daily., Disp: 90 tablet, Rfl: 0   Study -  LIBREXIA-STROKE - milvexian 25 mg or placebo tablet (PI-Sethi), Take 1 tablet by mouth 2 (two) times daily. For Investigational Use Only. Bring all bottles back to next study visit. Contact [facility] with questions regarding this medication., Disp: 210 tablet, Rfl: 0  Consent:   N/A  Disposition:   62-month follow-up sooner if needed   Signed, Madonna Large, DO, [facility]  A Division of [facility] [address] [place], [place] [address]  "

## 2024-03-05 NOTE — Telephone Encounter (Addendum)
 Called pt and let him know the below Lab Results per Dr. Rosemarie.   ----- Message from Eather Rosemarie, MD sent at 03/01/2024  4:35 PM EST ----- Kindly inform the patient that blood test for lupus as well as abnormal clotting antibodies was normal.  Nothing to worry about

## 2024-03-05 NOTE — Patient Instructions (Addendum)
 Medication Instructions:  STOP taking Metoprolol  Succinate  START taking Carvedilol  (Coreg ) 6.25 mg. Take one (1) tablet by mouth twice daily.   INCREASE Lisinopril  (Zestril ) to 40 mg. Take one (1) tablet by mouth once daily at night.  *If you need a refill on your cardiac medications before your next appointment, please call your pharmacy*  Lab Work: Hemoglobin and Hematocrit, BMP in 1 week  If you have labs (blood work) drawn today and your tests are completely normal, you will receive your results only by: MyChart Message (if you have MyChart) OR A paper copy in the mail If you have any lab test that is abnormal or we need to change your treatment, we will call you to review the results.  Testing/Procedures: Cardiac MRI  Your physician has requested that you have a cardiac MRI. Cardiac MRI uses a computer to create images of your heart as its beating, producing both still and moving pictures of your heart and major blood vessels. For further information please visit [email or web address]. Please follow the instruction sheet given to you today for more information.   Vascular Ultrasound Renal Duplex  Your physician has requested that you have a renal artery duplex. During this test, an ultrasound is used to evaluate blood flow to the kidneys. Allow one hour for this exam. Do not eat after midnight the day before and avoid carbonated beverages. Take your medications as you usually do.  Follow-Up: At [facility], you and your health needs are our priority.  As part of our continuing mission to provide you with exceptional heart care, our providers are all part of one team.  This team includes your primary Cardiologist (physician) and Advanced Practice Providers or APPs (Physician Assistants and Nurse Practitioners) who all work together to provide you with the care you need, when you need it.  Your next appointment:   3 month(s)  Provider:   Madonna Large, DO    We recommend  signing up for the patient portal called MyChart.  Sign up information is provided on this After Visit Summary.  MyChart is used to connect with patients for Virtual Visits (Telemedicine).  Patients are able to view lab/test results, encounter notes, upcoming appointments, etc.  Non-urgent messages can be sent to your provider as well.   To learn more about what you can do with MyChart, go to [email or web address].   Other Instructions  We are referring you to [facility] Sleep Studies with Dr. Harl.  We are referring you our Pharm-D department for management of your Hypertension.

## 2024-03-14 ENCOUNTER — Ambulatory Visit (HOSPITAL_COMMUNITY)
Admission: RE | Admit: 2024-03-14 | Discharge: 2024-03-14 | Disposition: A | Discharge status: Home / Self Care | Source: Ambulatory Visit | Attending: Cardiology | Admitting: Cardiology

## 2024-03-14 DIAGNOSIS — I10 Essential (primary) hypertension: Secondary | ICD-10-CM | POA: Insufficient documentation

## 2024-03-15 ENCOUNTER — Telehealth: Payer: Self-pay | Admitting: Cardiology

## 2024-03-15 NOTE — Telephone Encounter (Signed)
 Jonel,  Please have his PCP do that paperwork and refund his money.  From cardiology standpoint he was referred for hypertension management.  His ejection fraction is normal 60-65% and no obstructive CAD per CCTA.   Dr. Hadley Soileau

## 2024-03-15 NOTE — Telephone Encounter (Signed)
 Patient brought in a Short Term Disability form today from [employer]. Patient signed the Release of Information and paid $29 fee.  Form in Dr. Tyree box.

## 2024-03-19 ENCOUNTER — Ambulatory Visit: Payer: Self-pay | Admitting: Cardiology

## 2024-03-21 ENCOUNTER — Other Ambulatory Visit: Payer: Self-pay | Admitting: Cardiology

## 2024-03-21 ENCOUNTER — Ambulatory Visit (HOSPITAL_COMMUNITY)
Admission: RE | Admit: 2024-03-21 | Discharge: 2024-03-21 | Disposition: A | Source: Ambulatory Visit | Attending: Cardiology

## 2024-03-21 DIAGNOSIS — I517 Cardiomegaly: Secondary | ICD-10-CM

## 2024-03-21 MED ORDER — GADOBUTROL 1 MMOL/ML IV SOLN
11.0000 mL | Freq: Once | INTRAVENOUS | Status: AC | PRN
  Administered 2024-03-21: 11 mL via INTRAVENOUS

## 2024-03-22 ENCOUNTER — Telehealth: Payer: Self-pay | Admitting: Neurology

## 2024-03-22 ENCOUNTER — Ambulatory Visit: Payer: Self-pay | Admitting: Physician Assistant

## 2024-03-22 NOTE — Telephone Encounter (Signed)
 Pt came to drop off disability forms to be filled out. Paid $50 fee.

## 2024-04-02 ENCOUNTER — Ambulatory Visit

## 2024-04-10 ENCOUNTER — Institutional Professional Consult (permissible substitution): Admitting: Neurology

## 2024-05-01 ENCOUNTER — Encounter: Admitting: Family Medicine

## 2024-05-08 ENCOUNTER — Ambulatory Visit: Admitting: Family Medicine

## 2024-05-22 ENCOUNTER — Ambulatory Visit: Payer: Self-pay | Admitting: Family Medicine

## 2024-06-01 ENCOUNTER — Ambulatory Visit: Admitting: Cardiology

## 2025-03-07 ENCOUNTER — Ambulatory Visit: Admitting: Neurology
